# Patient Record
Sex: Male | Born: 1960 | Race: Black or African American | Hispanic: No | Marital: Single | Smoking: Current every day smoker
Health system: Southern US, Community
[De-identification: ages and names within clinical notes are randomized; demographics above are authoritative.]

## PROBLEM LIST (undated history)

## (undated) DIAGNOSIS — F209 Schizophrenia, unspecified: Secondary | ICD-10-CM

## (undated) DIAGNOSIS — F329 Major depressive disorder, single episode, unspecified: Secondary | ICD-10-CM

## (undated) DIAGNOSIS — F419 Anxiety disorder, unspecified: Secondary | ICD-10-CM

## (undated) DIAGNOSIS — E7800 Pure hypercholesterolemia, unspecified: Secondary | ICD-10-CM

## (undated) DIAGNOSIS — N200 Calculus of kidney: Secondary | ICD-10-CM

## (undated) DIAGNOSIS — I10 Essential (primary) hypertension: Secondary | ICD-10-CM

## (undated) DIAGNOSIS — M797 Fibromyalgia: Secondary | ICD-10-CM

## (undated) DIAGNOSIS — F32A Depression, unspecified: Secondary | ICD-10-CM

## (undated) DIAGNOSIS — G8929 Other chronic pain: Secondary | ICD-10-CM

## (undated) DIAGNOSIS — K219 Gastro-esophageal reflux disease without esophagitis: Secondary | ICD-10-CM

## (undated) HISTORY — PX: CERVICAL DISC SURGERY: SHX588

## (undated) HISTORY — PX: LITHOTRIPSY: SUR834

## (undated) HISTORY — PX: HERNIA REPAIR: SHX51

---

## 2001-05-16 ENCOUNTER — Encounter: Payer: Self-pay | Admitting: Urology

## 2001-05-16 ENCOUNTER — Ambulatory Visit (HOSPITAL_COMMUNITY): Admission: RE | Admit: 2001-05-16 | Discharge: 2001-05-16 | Payer: Self-pay | Admitting: Urology

## 2003-11-20 ENCOUNTER — Ambulatory Visit (HOSPITAL_COMMUNITY): Admission: RE | Admit: 2003-11-20 | Discharge: 2003-11-20 | Payer: Self-pay | Admitting: Urology

## 2003-12-19 ENCOUNTER — Ambulatory Visit (HOSPITAL_COMMUNITY): Admission: RE | Admit: 2003-12-19 | Discharge: 2003-12-19 | Payer: Self-pay | Admitting: Family Medicine

## 2004-01-10 ENCOUNTER — Emergency Department (HOSPITAL_COMMUNITY): Admission: EM | Admit: 2004-01-10 | Discharge: 2004-01-10 | Payer: Self-pay | Admitting: Emergency Medicine

## 2005-09-16 ENCOUNTER — Encounter (INDEPENDENT_AMBULATORY_CARE_PROVIDER_SITE_OTHER): Payer: Self-pay | Admitting: Family Medicine

## 2005-12-27 ENCOUNTER — Ambulatory Visit (HOSPITAL_COMMUNITY): Admission: RE | Admit: 2005-12-27 | Discharge: 2005-12-27 | Payer: Self-pay | Admitting: Urology

## 2006-02-14 ENCOUNTER — Ambulatory Visit (HOSPITAL_COMMUNITY): Admission: RE | Admit: 2006-02-14 | Discharge: 2006-02-14 | Payer: Self-pay | Admitting: Urology

## 2006-05-17 ENCOUNTER — Ambulatory Visit: Payer: Self-pay | Admitting: Family Medicine

## 2006-05-25 ENCOUNTER — Encounter (INDEPENDENT_AMBULATORY_CARE_PROVIDER_SITE_OTHER): Payer: Self-pay | Admitting: Family Medicine

## 2006-05-25 LAB — CONVERTED CEMR LAB
Cholesterol: 234 mg/dL
Triglycerides: 407 mg/dL

## 2006-05-31 ENCOUNTER — Ambulatory Visit: Payer: Self-pay | Admitting: Family Medicine

## 2006-06-06 ENCOUNTER — Ambulatory Visit (HOSPITAL_COMMUNITY): Admission: RE | Admit: 2006-06-06 | Discharge: 2006-06-06 | Payer: Self-pay | Admitting: Family Medicine

## 2006-06-06 ENCOUNTER — Ambulatory Visit: Payer: Self-pay | Admitting: Family Medicine

## 2006-06-16 ENCOUNTER — Ambulatory Visit: Payer: Self-pay | Admitting: Family Medicine

## 2006-06-20 ENCOUNTER — Ambulatory Visit: Payer: Self-pay | Admitting: Family Medicine

## 2006-06-27 ENCOUNTER — Ambulatory Visit: Payer: Self-pay | Admitting: Family Medicine

## 2006-06-27 LAB — CONVERTED CEMR LAB: Hemoglobin: 12.8 g/dL

## 2006-07-01 ENCOUNTER — Ambulatory Visit: Payer: Self-pay | Admitting: Family Medicine

## 2006-07-01 ENCOUNTER — Ambulatory Visit: Payer: Self-pay | Admitting: Gastroenterology

## 2006-07-05 ENCOUNTER — Ambulatory Visit: Payer: Self-pay | Admitting: Family Medicine

## 2006-07-07 ENCOUNTER — Encounter (INDEPENDENT_AMBULATORY_CARE_PROVIDER_SITE_OTHER): Payer: Self-pay | Admitting: Family Medicine

## 2006-07-19 ENCOUNTER — Ambulatory Visit: Payer: Self-pay | Admitting: Internal Medicine

## 2006-07-25 ENCOUNTER — Encounter (INDEPENDENT_AMBULATORY_CARE_PROVIDER_SITE_OTHER): Payer: Self-pay | Admitting: *Deleted

## 2006-07-25 ENCOUNTER — Ambulatory Visit (HOSPITAL_COMMUNITY): Admission: RE | Admit: 2006-07-25 | Discharge: 2006-07-25 | Payer: Self-pay | Admitting: Gastroenterology

## 2006-07-25 ENCOUNTER — Ambulatory Visit: Payer: Self-pay | Admitting: Gastroenterology

## 2006-08-05 ENCOUNTER — Ambulatory Visit: Payer: Self-pay | Admitting: Family Medicine

## 2006-08-16 ENCOUNTER — Encounter: Payer: Self-pay | Admitting: Family Medicine

## 2006-08-16 ENCOUNTER — Ambulatory Visit (HOSPITAL_COMMUNITY): Admission: RE | Admit: 2006-08-16 | Discharge: 2006-08-16 | Payer: Self-pay | Admitting: Urology

## 2006-08-16 DIAGNOSIS — F329 Major depressive disorder, single episode, unspecified: Secondary | ICD-10-CM

## 2006-08-16 DIAGNOSIS — M25579 Pain in unspecified ankle and joints of unspecified foot: Secondary | ICD-10-CM

## 2006-08-16 DIAGNOSIS — E669 Obesity, unspecified: Secondary | ICD-10-CM

## 2006-08-16 DIAGNOSIS — F528 Other sexual dysfunction not due to a substance or known physiological condition: Secondary | ICD-10-CM

## 2006-08-16 DIAGNOSIS — F200 Paranoid schizophrenia: Secondary | ICD-10-CM

## 2006-08-16 DIAGNOSIS — R7989 Other specified abnormal findings of blood chemistry: Secondary | ICD-10-CM | POA: Insufficient documentation

## 2006-08-16 DIAGNOSIS — I10 Essential (primary) hypertension: Secondary | ICD-10-CM

## 2006-08-16 DIAGNOSIS — K219 Gastro-esophageal reflux disease without esophagitis: Secondary | ICD-10-CM | POA: Insufficient documentation

## 2006-08-16 DIAGNOSIS — N200 Calculus of kidney: Secondary | ICD-10-CM | POA: Insufficient documentation

## 2006-08-16 DIAGNOSIS — F411 Generalized anxiety disorder: Secondary | ICD-10-CM | POA: Insufficient documentation

## 2006-08-16 DIAGNOSIS — E785 Hyperlipidemia, unspecified: Secondary | ICD-10-CM

## 2006-08-16 DIAGNOSIS — J40 Bronchitis, not specified as acute or chronic: Secondary | ICD-10-CM

## 2006-08-16 DIAGNOSIS — F17200 Nicotine dependence, unspecified, uncomplicated: Secondary | ICD-10-CM

## 2006-09-15 ENCOUNTER — Ambulatory Visit: Payer: Self-pay | Admitting: Family Medicine

## 2006-10-10 ENCOUNTER — Encounter (INDEPENDENT_AMBULATORY_CARE_PROVIDER_SITE_OTHER): Payer: Self-pay | Admitting: Internal Medicine

## 2006-10-10 ENCOUNTER — Encounter (INDEPENDENT_AMBULATORY_CARE_PROVIDER_SITE_OTHER): Payer: Self-pay | Admitting: Family Medicine

## 2006-10-10 LAB — CONVERTED CEMR LAB
AST: 29 units/L (ref 0–37)
Albumin: 4 g/dL (ref 3.5–5.2)
BUN: 12 mg/dL (ref 6–23)
Basophils Relative: 0 % (ref 0–1)
Blood Glucose, Fasting: 115 mg/dL
Blood Glucose, Fasting: 115 mg/dL
CO2: 27 meq/L (ref 19–32)
Calcium: 9.3 mg/dL
Calcium: 9.3 mg/dL (ref 8.4–10.5)
Cholesterol: 239 mg/dL
Cholesterol: 239 mg/dL — ABNORMAL HIGH (ref 0–200)
Creatinine, Ser: 0.97 mg/dL
Eosinophils Relative: 2 % (ref 0–5)
HCT: 43.2 %
HCT: 43.2 % (ref 39.0–52.0)
HDL: 33 mg/dL — ABNORMAL LOW (ref >39)
Hemoglobin: 13.8 g/dL
Lymphocytes Relative: 44 % (ref 12–46)
Lymphs Abs: 4.3 10*3/uL — ABNORMAL HIGH (ref 0.7–3.3)
Monocytes Absolute: 0.7 10*3/uL (ref 0.2–0.7)
Monocytes Relative: 8 % (ref 3–11)
Neutrophils Relative %: 47 % (ref 43–77)
Platelets: 268 10*3/uL (ref 150–400)
RBC count: 5.05 10*6/uL
RBC: 5.05 M/uL (ref 4.22–5.81)
Total Protein: 7 g/dL (ref 6.0–8.3)
Triglycerides: 442 mg/dL
Triglycerides: 442 mg/dL — ABNORMAL HIGH (ref <150)

## 2006-10-14 ENCOUNTER — Ambulatory Visit: Payer: Self-pay | Admitting: Family Medicine

## 2006-11-11 ENCOUNTER — Ambulatory Visit: Payer: Self-pay | Admitting: Family Medicine

## 2006-11-23 ENCOUNTER — Encounter (INDEPENDENT_AMBULATORY_CARE_PROVIDER_SITE_OTHER): Payer: Self-pay | Admitting: Family Medicine

## 2006-12-13 ENCOUNTER — Telehealth (INDEPENDENT_AMBULATORY_CARE_PROVIDER_SITE_OTHER): Payer: Self-pay | Admitting: Family Medicine

## 2007-01-13 ENCOUNTER — Encounter (INDEPENDENT_AMBULATORY_CARE_PROVIDER_SITE_OTHER): Payer: Self-pay | Admitting: Family Medicine

## 2007-01-26 ENCOUNTER — Ambulatory Visit: Payer: Self-pay | Admitting: Family Medicine

## 2007-02-09 ENCOUNTER — Ambulatory Visit: Payer: Self-pay | Admitting: Family Medicine

## 2007-02-09 LAB — CONVERTED CEMR LAB: Cholesterol, target level: 200 mg/dL

## 2007-03-07 ENCOUNTER — Ambulatory Visit: Payer: Self-pay | Admitting: Family Medicine

## 2007-03-09 ENCOUNTER — Encounter (INDEPENDENT_AMBULATORY_CARE_PROVIDER_SITE_OTHER): Payer: Self-pay | Admitting: Family Medicine

## 2007-03-23 ENCOUNTER — Encounter (INDEPENDENT_AMBULATORY_CARE_PROVIDER_SITE_OTHER): Payer: Self-pay | Admitting: Family Medicine

## 2007-04-05 ENCOUNTER — Ambulatory Visit: Payer: Self-pay | Admitting: Family Medicine

## 2007-04-06 ENCOUNTER — Encounter (INDEPENDENT_AMBULATORY_CARE_PROVIDER_SITE_OTHER): Payer: Self-pay | Admitting: Family Medicine

## 2007-04-07 ENCOUNTER — Encounter (INDEPENDENT_AMBULATORY_CARE_PROVIDER_SITE_OTHER): Payer: Self-pay | Admitting: Family Medicine

## 2007-04-19 ENCOUNTER — Encounter (INDEPENDENT_AMBULATORY_CARE_PROVIDER_SITE_OTHER): Payer: Self-pay | Admitting: Family Medicine

## 2007-05-01 ENCOUNTER — Encounter (INDEPENDENT_AMBULATORY_CARE_PROVIDER_SITE_OTHER): Payer: Self-pay | Admitting: Family Medicine

## 2007-05-01 ENCOUNTER — Emergency Department (HOSPITAL_COMMUNITY): Admission: EM | Admit: 2007-05-01 | Discharge: 2007-05-01 | Payer: Self-pay | Admitting: Emergency Medicine

## 2007-05-01 ENCOUNTER — Telehealth (INDEPENDENT_AMBULATORY_CARE_PROVIDER_SITE_OTHER): Payer: Self-pay | Admitting: Family Medicine

## 2007-05-01 ENCOUNTER — Telehealth (INDEPENDENT_AMBULATORY_CARE_PROVIDER_SITE_OTHER): Payer: Self-pay | Admitting: *Deleted

## 2007-07-11 ENCOUNTER — Encounter (INDEPENDENT_AMBULATORY_CARE_PROVIDER_SITE_OTHER): Payer: Self-pay | Admitting: Family Medicine

## 2007-08-18 ENCOUNTER — Encounter (INDEPENDENT_AMBULATORY_CARE_PROVIDER_SITE_OTHER): Payer: Self-pay | Admitting: Family Medicine

## 2008-02-02 ENCOUNTER — Encounter (INDEPENDENT_AMBULATORY_CARE_PROVIDER_SITE_OTHER): Payer: Self-pay | Admitting: Family Medicine

## 2008-11-06 ENCOUNTER — Ambulatory Visit (HOSPITAL_COMMUNITY): Admission: RE | Admit: 2008-11-06 | Discharge: 2008-11-06 | Payer: Self-pay | Admitting: Podiatry

## 2009-01-22 ENCOUNTER — Encounter: Admission: RE | Admit: 2009-01-22 | Discharge: 2009-01-22 | Payer: Self-pay | Admitting: Orthopedic Surgery

## 2010-11-01 ENCOUNTER — Encounter: Payer: Self-pay | Admitting: Orthopedic Surgery

## 2011-01-25 LAB — CREATININE, SERUM
Creatinine, Ser: 0.78 mg/dL (ref 0.4–1.5)
GFR calc non Af Amer: >60 mL/min (ref >60)

## 2011-02-26 NOTE — Op Note (Signed)
NAME:  EDITH, LORD             ACCOUNT NO.:  [id number]   MEDICAL RECORD NO.:  [id number]          PATIENT TYPE:  AMB   LOCATION:  DAY                           FACILITY:  [facility]   PHYSICIAN:  Kassie Mends, M.D.      DATE OF BIRTH:  [birth year]   DATE OF PROCEDURE:  07/25/2006  DATE OF DISCHARGE:  07/25/2006                                 OPERATIVE REPORT   REFERRING PHYSICIAN:  Dr. Franchot Heidelberg   PROCEDURE:  1. Colonoscopy with cold forceps biopsy.  2. Esophagogastroduodenoscopy.   INDICATION FOR EXAMINATION:  Mr. Brahmbhatt was a 50 year old male with rectal  bleeding on an unspecified anti-inflammatory drug.  The patient is unable to  recall the name of the drug.  His hemoglobin decreased from 13 to 10.6.  He  has a significant past medical history of hemorrhoids.   FINDINGS:  1. 3mm descending colon polyp removed via cold forceps.  2. Moderate internal hemorrhoids.  Otherwise no polyps, masses,      inflammatory changes or vascular ectasia seen on colonoscopy.  3. Normal esophagus without evidence of Barrett's.  Normal stomach without      evidence of erosion, erythema or ulceration.  Normal duodenal bulb and      second portion of the duodenum.   RECOMMENDATIONS:  1. Screening colonoscopy in 5 years if polyp adenomatous.  2. High-fiber diet.  Patient given information on hemorrhoids, high-fiber      fiber diet, and polyps.  3. Colace 1 mg twice a day.  Citrucel 1 to 2 times daily.  4. Follow up with Dr. Erby Pian.   PROCEDURE TECHNIQUE:  Physical exam was performed, and informed consent was  obtained from the patient after explaining the benefits, risks and  alternatives to the procedure.  The patient was connected to the monitor and  placed in the left lateral position.  Continuous suction was provided by  nasal cannula, and IV medicine administered through an indwelling cannula.  After administration of sedation and rectal exam, the patient's rectum was  intubated, and the scope was passed under direct visualization to the cecum.  The scope was withdrawn slowly by carefully examining the color, texture,  anatomy and integrity of mucosa on the way out.   After colonoscopy, the diagnostic gastroscope was passed into the patients  esophagus and passed undirect visualization to the second portion of the  duodenum. The scope was withdrawn slowly by carefully examining the color,  texture, anatomy and integrity of mucosa on the way out.  The patient was  recovered in endoscopy suite and discharged home in satisfactory condition.      Kassie Mends, M.D.  Electronically Signed     SM/MEDQ  D:  07/26/2006  T:  07/27/2006  Job:  [id number]   cc:   Theophilus Bones, M.D.
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
## 2011-07-05 ENCOUNTER — Encounter: Payer: Self-pay | Admitting: Gastroenterology

## 2011-07-26 LAB — RAPID URINE DRUG SCREEN, HOSP PERFORMED: Barbiturates: NOT DETECTED

## 2012-01-05 ENCOUNTER — Other Ambulatory Visit (HOSPITAL_COMMUNITY): Payer: Self-pay | Admitting: Family Medicine

## 2012-01-05 DIAGNOSIS — R52 Pain, unspecified: Secondary | ICD-10-CM

## 2012-01-06 ENCOUNTER — Other Ambulatory Visit (HOSPITAL_COMMUNITY): Payer: Self-pay

## 2012-01-06 ENCOUNTER — Ambulatory Visit (HOSPITAL_COMMUNITY)
Admission: RE | Admit: 2012-01-06 | Discharge: 2012-01-06 | Disposition: A | Discharge status: Home / Self Care | Payer: Medicare Other | Source: Ambulatory Visit | Attending: Family Medicine | Admitting: Family Medicine

## 2012-01-06 DIAGNOSIS — R1033 Periumbilical pain: Secondary | ICD-10-CM | POA: Insufficient documentation

## 2012-01-06 DIAGNOSIS — K429 Umbilical hernia without obstruction or gangrene: Secondary | ICD-10-CM | POA: Insufficient documentation

## 2012-01-06 DIAGNOSIS — N200 Calculus of kidney: Secondary | ICD-10-CM | POA: Insufficient documentation

## 2012-01-06 DIAGNOSIS — R933 Abnormal findings on diagnostic imaging of other parts of digestive tract: Secondary | ICD-10-CM | POA: Insufficient documentation

## 2012-01-06 DIAGNOSIS — R52 Pain, unspecified: Secondary | ICD-10-CM

## 2012-01-06 MED ORDER — IOHEXOL 300 MG/ML  SOLN
100.0000 mL | Freq: Once | INTRAMUSCULAR | Status: AC | PRN
  Administered 2012-01-06: 100 mL via INTRAVENOUS

## 2012-01-27 ENCOUNTER — Encounter (HOSPITAL_COMMUNITY): Payer: Self-pay | Admitting: Pharmacy Technician

## 2012-01-27 ENCOUNTER — Other Ambulatory Visit (INDEPENDENT_AMBULATORY_CARE_PROVIDER_SITE_OTHER): Payer: Self-pay | Admitting: *Deleted

## 2012-01-27 ENCOUNTER — Telehealth (INDEPENDENT_AMBULATORY_CARE_PROVIDER_SITE_OTHER): Payer: Self-pay | Admitting: *Deleted

## 2012-01-27 ENCOUNTER — Encounter (INDEPENDENT_AMBULATORY_CARE_PROVIDER_SITE_OTHER): Payer: Self-pay | Admitting: *Deleted

## 2012-01-27 DIAGNOSIS — R935 Abnormal findings on diagnostic imaging of other abdominal regions, including retroperitoneum: Secondary | ICD-10-CM

## 2012-01-27 NOTE — Telephone Encounter (Signed)
Requesting MD: bradford  PCP:       Name & DOB: Debroah Loop Conant     Procedure: egd  Reason/Indication:  Abnormal ct  Has patient had this procedure before?    If so, when, by whom and where?    Is there a family history of colon cancer?    Who?  What age when diagnosed?    Is patient diabetic?   yes      Does patient have prosthetic heart valve?  no  Do you have a pacemaker?  no  Has patient had joint replacement within last 12 months?  no  Is patient on Coumadin, Plavix and/or Aspirin? no  Medications: gabapentin 300 mg 2 tab tid, amlodipine 5 mg daily, olanzapine 15 mg, alprazolam 1 mg tid, pravastatin 40 mg, metformin 500 mg tid, dexilant 60 mg daily  Allergies: anti-inflammatory  Pharmacy:   Medication Adjustment:   Procedure date & time: 01/28/12 @ 1200

## 2012-01-27 NOTE — Telephone Encounter (Signed)
Hold evening dose of metformin

## 2012-01-27 NOTE — Telephone Encounter (Signed)
Agree 
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
## 2012-01-28 ENCOUNTER — Ambulatory Visit (HOSPITAL_COMMUNITY)
Admission: RE | Admit: 2012-01-28 | Discharge: 2012-01-28 | Disposition: A | Discharge status: Home / Self Care | Payer: Medicare Other | Source: Ambulatory Visit | Attending: Internal Medicine | Admitting: Internal Medicine

## 2012-01-28 ENCOUNTER — Encounter (HOSPITAL_COMMUNITY)
Admission: RE | Disposition: A | Discharge status: Home / Self Care | Payer: Self-pay | Source: Ambulatory Visit | Attending: Internal Medicine

## 2012-01-28 ENCOUNTER — Encounter (HOSPITAL_COMMUNITY): Payer: Self-pay | Admitting: *Deleted

## 2012-01-28 DIAGNOSIS — I10 Essential (primary) hypertension: Secondary | ICD-10-CM | POA: Insufficient documentation

## 2012-01-28 DIAGNOSIS — Z01812 Encounter for preprocedural laboratory examination: Secondary | ICD-10-CM | POA: Insufficient documentation

## 2012-01-28 DIAGNOSIS — E7800 Pure hypercholesterolemia, unspecified: Secondary | ICD-10-CM | POA: Insufficient documentation

## 2012-01-28 DIAGNOSIS — R12 Heartburn: Secondary | ICD-10-CM | POA: Insufficient documentation

## 2012-01-28 DIAGNOSIS — R933 Abnormal findings on diagnostic imaging of other parts of digestive tract: Secondary | ICD-10-CM

## 2012-01-28 DIAGNOSIS — Z79899 Other long term (current) drug therapy: Secondary | ICD-10-CM | POA: Insufficient documentation

## 2012-01-28 DIAGNOSIS — R935 Abnormal findings on diagnostic imaging of other abdominal regions, including retroperitoneum: Secondary | ICD-10-CM

## 2012-01-28 DIAGNOSIS — K219 Gastro-esophageal reflux disease without esophagitis: Secondary | ICD-10-CM

## 2012-01-28 DIAGNOSIS — E119 Type 2 diabetes mellitus without complications: Secondary | ICD-10-CM | POA: Insufficient documentation

## 2012-01-28 HISTORY — DX: Calculus of kidney: N20.0

## 2012-01-28 HISTORY — DX: Other chronic pain: G89.29

## 2012-01-28 HISTORY — DX: Anxiety disorder, unspecified: F41.9

## 2012-01-28 HISTORY — DX: Major depressive disorder, single episode, unspecified: F32.9

## 2012-01-28 HISTORY — DX: Fibromyalgia: M79.7

## 2012-01-28 HISTORY — DX: Pure hypercholesterolemia, unspecified: E78.00

## 2012-01-28 HISTORY — DX: Depression, unspecified: F32.A

## 2012-01-28 HISTORY — DX: Gastro-esophageal reflux disease without esophagitis: K21.9

## 2012-01-28 HISTORY — DX: Schizophrenia, unspecified: F20.9

## 2012-01-28 HISTORY — PX: ESOPHAGOGASTRODUODENOSCOPY: SHX5428

## 2012-01-28 HISTORY — DX: Essential (primary) hypertension: I10

## 2012-01-28 LAB — GLUCOSE, CAPILLARY: Glucose-Capillary: 95 mg/dL (ref 70–99)

## 2012-01-28 SURGERY — EGD (ESOPHAGOGASTRODUODENOSCOPY)
Anesthesia: Moderate Sedation

## 2012-01-28 MED ORDER — MEPERIDINE HCL 50 MG/ML IJ SOLN
INTRAMUSCULAR | Status: AC
  Filled 2012-01-28: qty 1

## 2012-01-28 MED ORDER — STERILE WATER FOR IRRIGATION IR SOLN
Status: DC | PRN
  Administered 2012-01-28: 13:00:00

## 2012-01-28 MED ORDER — SODIUM CHLORIDE 0.45 % IV SOLN
Freq: Once | INTRAVENOUS | Status: AC
  Administered 2012-01-28: 13:00:00 via INTRAVENOUS

## 2012-01-28 MED ORDER — MIDAZOLAM HCL 5 MG/5ML IJ SOLN
INTRAMUSCULAR | Status: AC
  Filled 2012-01-28: qty 10

## 2012-01-28 MED ORDER — MEPERIDINE HCL 25 MG/ML IJ SOLN
INTRAMUSCULAR | Status: DC | PRN
  Administered 2012-01-28 (×2): 25 mg via INTRAVENOUS

## 2012-01-28 MED ORDER — MIDAZOLAM HCL 5 MG/5ML IJ SOLN
INTRAMUSCULAR | Status: DC | PRN
  Administered 2012-01-28: 3 mg via INTRAVENOUS
  Administered 2012-01-28: 2 mg via INTRAVENOUS
  Administered 2012-01-28: 3 mg via INTRAVENOUS

## 2012-01-28 MED ORDER — BUTAMBEN-TETRACAINE-BENZOCAINE 2-2-14 % EX AERO
INHALATION_SPRAY | CUTANEOUS | Status: DC | PRN
  Administered 2012-01-28: 1 via TOPICAL
  Administered 2012-01-28: 2 via TOPICAL

## 2012-01-28 NOTE — Op Note (Signed)
EGD PROCEDURE REPORT  PATIENT:  Gregory Payne  MR#:  [id number] Birthdate:  1960-12-14, 50 y.o., male Endoscopist:  Dr. Malissa Hippo, MD Referred By:  Dr. Barbaraann Barthel, M.D.  Procedure Date: 01/28/2012  Procedure:   EGD  Indications:  Patient is 51 year old asthmatic and male with abdominal and pelvic CT recently and thickening of the gastric wall. He has chronic GERD and symptoms of uncontrolled with PPI. He has a history of GI bleed secondary to NSAID therapy 6 years ago. He does not take any NSAIDs.            Informed Consent:  The risks, benefits, alternatives & imponderables which include, but are not limited to, bleeding, infection, perforation, drug reaction and potential missed lesion have been reviewed.  The potential for biopsy, lesion removal, esophageal dilation, etc. have also been discussed.  Questions have been answered.  All parties agreeable.  Please see history & physical in medical record for more information.  Medications:  Demerol 50 mg IV Versed 8 mg IV Cetacaine spray topically for oropharyngeal anesthesia  Description of procedure:  The endoscope was introduced through the mouth and advanced to the second portion of the duodenum without difficulty or limitations. The mucosal surfaces were surveyed very carefully during advancement of the scope and upon withdrawal.  Findings:  Esophagus:  Normal mucosa of the esophagus. GEJ:  45 cm Stomach:  Stomach was empty and distended very well with insufflation. Folds in the proximal stomach were normal. Examination of mucosa at body, antrum, pyloric channel angularis, fundus and cardia was normal. Duodenum:  Normal bulbar and post bulbar medical.  Therapeutic/Diagnostic Maneuvers Performed:  None  Complications:  None  Impression: Normal esophagogastroduodenoscopy.   Recommendations:  Standard instructions given. Patient to followup with Dr. Malvin Johns regarding periumblical  Hernia.  Sejla Marzano U   01/28/2012  1:45 PM  CC: Dr. Isabella Stalling, MD, MD          Dr. Barbaraann Barthel, MD

## 2012-01-28 NOTE — Discharge Instructions (Signed)
Resume medications and diet. No driving for [id number]. Follow with Dr. Malvin Johns as planned

## 2012-01-28 NOTE — H&P (Signed)
Gregory Payne is an 51 y.o. male.   Chief Complaint: Patient is here for esophagogastroduodenoscopy. HPI: Patient is a 51 year old black male who recently underwent abdominopelvic CT because of peri-umblical knot. This study reveals thickening to the gastric wall. Patient gives several year history of heartburn. Previously was on AcipHex and now on Dexilant. He denies epigastric pain nausea vomiting dysphagia anorexia or melena. He does not take any NSAIDs.  Past Medical History  Diagnosis Date  . Diabetes mellitus   . Hypertension   . Hypercholesteremia   . Anxiety   . Depression   . Chronic pain   . Fibromyalgia   . GERD (gastroesophageal reflux disease)   . Kidney stones   . Schizophrenia     Past Surgical History  Procedure Date  . Lithotripsy   . Hernia repair     left inguinal hernia  . Cervical disc surgery     Family History  Problem Relation Age of Onset  . Colon cancer Neg Hx    Social History:  reports that he has been smoking.  He does not have any smokeless tobacco history on file. He reports that he drinks alcohol. He reports that he uses illicit drugs (Marijuana).  Allergies:  Allergies  Allergen Reactions  . Naproxen     REACTION: GI bleed from internal Hemmorhoids    Medications Prior to Admission  Medication Dose Route Frequency Provider Last Rate Last Dose  . 0.45 % sodium chloride infusion   Intravenous Once Malissa Hippo, MD 20 mL/hr at 01/28/12 1236    . meperidine (DEMEROL) 50 MG/ML injection           . midazolam (VERSED) 5 MG/5ML injection            Medications Prior to Admission  Medication Sig Dispense Refill  . ALPRAZolam (XANAX) 1 MG tablet Take 1 mg by mouth 3 (three) times daily as needed.      Marland Kitchen amLODipine (NORVASC) 5 MG tablet Take 5 mg by mouth daily.      . cephALEXin (KEFLEX) 500 MG capsule Take 500 mg by mouth 4 (four) times daily.      Marland Kitchen dexlansoprazole (DEXILANT) 60 MG capsule Take 60 mg by mouth daily.      Marland Kitchen docusate  sodium (COLACE) 100 MG capsule Take 400 mg by mouth daily.      Marland Kitchen gabapentin (NEURONTIN) 300 MG capsule Take 600 mg by mouth 3 (three) times daily.      . metFORMIN (GLUCOPHAGE) 500 MG tablet Take 500 mg by mouth 3 (three) times daily with meals.      Marland Kitchen OLANZapine (ZYPREXA) 15 MG tablet Take 15 mg by mouth at bedtime.      . pravastatin (PRAVACHOL) 40 MG tablet Take 40 mg by mouth daily.        Results for orders placed during the hospital encounter of 01/28/12 (from the past 48 hour(s))  GLUCOSE, CAPILLARY     Status: Normal   Collection Time   01/28/12 12:26 PM      Component Value Range Comment   Glucose-Capillary 95  70 - 99 (mg/dL)    Comment 1 Documented in Chart      No results found.  Review of Systems  Constitutional: Negative for weight loss.  Gastrointestinal: Negative for diarrhea, constipation, blood in stool and melena.    Blood pressure 140/93, pulse 63, temperature 97.5 F (36.4 C), temperature source Oral, resp. rate 16, height 6\' 3"  (1.905 m), weight 243  lb (110.224 kg), SpO2 99.00%. Physical Exam  Constitutional: He appears well-developed and well-nourished.  HENT:  Mouth/Throat: Oropharynx is clear and moist.  Eyes: Conjunctivae are normal. No scleral icterus.  Neck: No thyromegaly present.  Cardiovascular: Normal rate, regular rhythm and normal heart sounds.   No murmur heard. Respiratory: Effort normal and breath sounds normal.  GI: Soft. He exhibits no distension and no mass. There is no tenderness.       Small hernia superior to umbilicus  Musculoskeletal: He exhibits no edema.  Lymphadenopathy:    He has no cervical adenopathy.  Neurological: He is alert.  Skin: Skin is warm and dry.     Assessment/Plan Abnormal CT with thickening to gastric wall. Diagnostic EGD.  Gregory Payne U 01/28/2012, 1:19 PM

## 2012-01-31 ENCOUNTER — Other Ambulatory Visit (HOSPITAL_COMMUNITY): Payer: Medicaid Other

## 2012-02-02 ENCOUNTER — Encounter (INDEPENDENT_AMBULATORY_CARE_PROVIDER_SITE_OTHER): Payer: Self-pay

## 2012-02-02 ENCOUNTER — Encounter (HOSPITAL_COMMUNITY): Payer: Self-pay | Admitting: Internal Medicine

## 2012-02-02 ENCOUNTER — Ambulatory Visit (INDEPENDENT_AMBULATORY_CARE_PROVIDER_SITE_OTHER): Payer: Medicaid Other | Admitting: Internal Medicine

## 2012-02-07 ENCOUNTER — Ambulatory Visit (HOSPITAL_COMMUNITY): Admission: RE | Admit: 2012-02-07 | Payer: Medicaid Other | Source: Ambulatory Visit | Admitting: General Surgery

## 2012-02-07 ENCOUNTER — Encounter (HOSPITAL_COMMUNITY): Admission: RE | Payer: Self-pay | Source: Ambulatory Visit

## 2012-02-07 SURGERY — REPAIR, HERNIA, UMBILICAL, ADULT
Anesthesia: General

## 2012-10-04 ENCOUNTER — Encounter (HOSPITAL_COMMUNITY): Payer: Self-pay

## 2012-10-04 ENCOUNTER — Emergency Department (HOSPITAL_COMMUNITY): Payer: Medicare Other

## 2012-10-04 ENCOUNTER — Inpatient Hospital Stay (HOSPITAL_COMMUNITY)
Admission: EM | Admit: 2012-10-04 | Discharge: 2012-10-10 | DRG: 917 | Disposition: A | Discharge status: Home / Self Care | Payer: Medicare Other | Attending: Internal Medicine | Admitting: Internal Medicine

## 2012-10-04 DIAGNOSIS — E119 Type 2 diabetes mellitus without complications: Secondary | ICD-10-CM | POA: Diagnosis present

## 2012-10-04 DIAGNOSIS — F17200 Nicotine dependence, unspecified, uncomplicated: Secondary | ICD-10-CM | POA: Diagnosis present

## 2012-10-04 DIAGNOSIS — J9600 Acute respiratory failure, unspecified whether with hypoxia or hypercapnia: Secondary | ICD-10-CM | POA: Diagnosis present

## 2012-10-04 DIAGNOSIS — E785 Hyperlipidemia, unspecified: Secondary | ICD-10-CM | POA: Diagnosis present

## 2012-10-04 DIAGNOSIS — F1410 Cocaine abuse, uncomplicated: Secondary | ICD-10-CM | POA: Diagnosis present

## 2012-10-04 DIAGNOSIS — R031 Nonspecific low blood-pressure reading: Secondary | ICD-10-CM | POA: Diagnosis not present

## 2012-10-04 DIAGNOSIS — I10 Essential (primary) hypertension: Secondary | ICD-10-CM | POA: Diagnosis present

## 2012-10-04 DIAGNOSIS — Z886 Allergy status to analgesic agent status: Secondary | ICD-10-CM

## 2012-10-04 DIAGNOSIS — F209 Schizophrenia, unspecified: Secondary | ICD-10-CM | POA: Diagnosis present

## 2012-10-04 DIAGNOSIS — F1010 Alcohol abuse, uncomplicated: Secondary | ICD-10-CM | POA: Diagnosis present

## 2012-10-04 DIAGNOSIS — T465X1A Poisoning by other antihypertensive drugs, accidental (unintentional), initial encounter: Principal | ICD-10-CM | POA: Diagnosis present

## 2012-10-04 DIAGNOSIS — G9340 Encephalopathy, unspecified: Secondary | ICD-10-CM

## 2012-10-04 DIAGNOSIS — T426X1A Poisoning by other antiepileptic and sedative-hypnotic drugs, accidental (unintentional), initial encounter: Secondary | ICD-10-CM | POA: Diagnosis present

## 2012-10-04 DIAGNOSIS — F200 Paranoid schizophrenia: Secondary | ICD-10-CM

## 2012-10-04 DIAGNOSIS — E7800 Pure hypercholesterolemia, unspecified: Secondary | ICD-10-CM | POA: Diagnosis present

## 2012-10-04 DIAGNOSIS — Y92009 Unspecified place in unspecified non-institutional (private) residence as the place of occurrence of the external cause: Secondary | ICD-10-CM

## 2012-10-04 DIAGNOSIS — Z23 Encounter for immunization: Secondary | ICD-10-CM

## 2012-10-04 DIAGNOSIS — F329 Major depressive disorder, single episode, unspecified: Secondary | ICD-10-CM | POA: Diagnosis present

## 2012-10-04 DIAGNOSIS — G92 Toxic encephalopathy: Secondary | ICD-10-CM | POA: Diagnosis present

## 2012-10-04 DIAGNOSIS — F411 Generalized anxiety disorder: Secondary | ICD-10-CM | POA: Diagnosis present

## 2012-10-04 DIAGNOSIS — T424X4A Poisoning by benzodiazepines, undetermined, initial encounter: Secondary | ICD-10-CM | POA: Diagnosis present

## 2012-10-04 DIAGNOSIS — K219 Gastro-esophageal reflux disease without esophagitis: Secondary | ICD-10-CM | POA: Diagnosis present

## 2012-10-04 DIAGNOSIS — IMO0002 Reserved for concepts with insufficient information to code with codable children: Secondary | ICD-10-CM | POA: Diagnosis not present

## 2012-10-04 DIAGNOSIS — F3289 Other specified depressive episodes: Secondary | ICD-10-CM | POA: Diagnosis present

## 2012-10-04 DIAGNOSIS — Z79899 Other long term (current) drug therapy: Secondary | ICD-10-CM

## 2012-10-04 DIAGNOSIS — F1210 Cannabis abuse, uncomplicated: Secondary | ICD-10-CM | POA: Diagnosis present

## 2012-10-04 DIAGNOSIS — G8929 Other chronic pain: Secondary | ICD-10-CM | POA: Diagnosis present

## 2012-10-04 DIAGNOSIS — T50992A Poisoning by other drugs, medicaments and biological substances, intentional self-harm, initial encounter: Secondary | ICD-10-CM | POA: Diagnosis present

## 2012-10-04 DIAGNOSIS — T50901A Poisoning by unspecified drugs, medicaments and biological substances, accidental (unintentional), initial encounter: Secondary | ICD-10-CM

## 2012-10-04 DIAGNOSIS — G929 Unspecified toxic encephalopathy: Secondary | ICD-10-CM | POA: Diagnosis present

## 2012-10-04 LAB — URINALYSIS, ROUTINE W REFLEX MICROSCOPIC
Leukocytes, UA: NEGATIVE
Nitrite: NEGATIVE
Specific Gravity, Urine: 1.025 (ref 1.005–1.030)
Urobilinogen, UA: 0.2 mg/dL (ref 0.0–1.0)
pH: 5.5 (ref 5.0–8.0)

## 2012-10-04 LAB — BLOOD GAS, ARTERIAL
Acid-base deficit: 3.6 mmol/L — ABNORMAL HIGH (ref 0.0–2.0)
Drawn by: 21694
O2 Content: 40 L/min
TCO2: 19 mmol/L (ref 0–100)
pCO2 arterial: 39 mmHg (ref 35.0–45.0)
pH, Arterial: 7.351 (ref 7.350–7.450)
pO2, Arterial: 81.1 mmHg (ref 80.0–100.0)

## 2012-10-04 LAB — COMPREHENSIVE METABOLIC PANEL
Albumin: 3.8 g/dL (ref 3.5–5.2)
BUN: 6 mg/dL (ref 6–23)
CO2: 22 mEq/L (ref 19–32)
Chloride: 109 mEq/L (ref 96–112)
Creatinine, Ser: 0.88 mg/dL (ref 0.50–1.35)
GFR calc Af Amer: >90 mL/min (ref >90)
GFR calc non Af Amer: >90 mL/min (ref >90)
Glucose, Bld: 78 mg/dL (ref 70–99)
Total Bilirubin: 0.3 mg/dL (ref 0.3–1.2)

## 2012-10-04 LAB — CBC WITH DIFFERENTIAL/PLATELET
Basophils Absolute: 0.1 10*3/uL (ref 0.0–0.1)
Basophils Relative: 1 % (ref 0–1)
HCT: 41.6 % (ref 39.0–52.0)
Hemoglobin: 13.3 g/dL (ref 13.0–17.0)
Lymphs Abs: 3.4 10*3/uL (ref 0.7–4.0)
MCHC: 32 g/dL (ref 30.0–36.0)
MCV: 81.3 fL (ref 78.0–100.0)
Monocytes Relative: 7 % (ref 3–12)
Neutro Abs: 2.7 10*3/uL (ref 1.7–7.7)
RDW: 15.3 % (ref 11.5–15.5)
WBC: 6.8 10*3/uL (ref 4.0–10.5)

## 2012-10-04 LAB — URINE MICROSCOPIC-ADD ON

## 2012-10-04 LAB — LIPASE, BLOOD: Lipase: 75 U/L — ABNORMAL HIGH (ref 11–59)

## 2012-10-04 LAB — RAPID URINE DRUG SCREEN, HOSP PERFORMED: Opiates: NOT DETECTED

## 2012-10-04 LAB — SALICYLATE LEVEL: Salicylate Lvl: 0 mg/dL — ABNORMAL LOW (ref 2.8–20.0)

## 2012-10-04 LAB — ETHANOL: Alcohol, Ethyl (B): 128 mg/dL — ABNORMAL HIGH (ref 0–11)

## 2012-10-04 LAB — ACETAMINOPHEN LEVEL: Acetaminophen (Tylenol), Serum: <15 ug/mL (ref 10–30)

## 2012-10-04 MED ORDER — PROPOFOL 10 MG/ML IV EMUL
INTRAVENOUS | Status: AC
  Filled 2012-10-04: qty 100

## 2012-10-04 MED ORDER — NALOXONE HCL 1 MG/ML IJ SOLN
INTRAMUSCULAR | Status: DC | PRN
  Administered 2012-10-04: 2 mg via INTRAMUSCULAR

## 2012-10-04 MED ORDER — SODIUM CHLORIDE 0.9 % IV BOLUS (SEPSIS): 1000.0000 mL | Freq: Once | INTRAVENOUS | Status: DC

## 2012-10-04 MED ORDER — SUCCINYLCHOLINE CHLORIDE 20 MG/ML IJ SOLN
INTRAMUSCULAR | Status: DC | PRN
  Administered 2012-10-04: 120 mg via INTRAVENOUS

## 2012-10-04 MED ORDER — DOPAMINE-DEXTROSE 3.2-5 MG/ML-% IV SOLN: 5.0000 ug/kg/min | Freq: Once | INTRAVENOUS | Status: DC

## 2012-10-04 MED ORDER — NALOXONE HCL 1 MG/ML IJ SOLN
INTRAMUSCULAR | Status: AC
  Filled 2012-10-04: qty 2

## 2012-10-04 MED ORDER — SODIUM CHLORIDE 0.9 % IV SOLN
INTRAVENOUS | Status: DC | PRN
  Administered 2012-10-04: 1000 mL via INTRAVENOUS

## 2012-10-04 MED ORDER — ETOMIDATE 2 MG/ML IV SOLN
INTRAVENOUS | Status: DC | PRN
  Administered 2012-10-04: 20 mg via INTRAVENOUS

## 2012-10-04 NOTE — ED Provider Notes (Signed)
History  This chart was scribed for Donnetta Hutching, MD by Manuela Schwartz, ED scribe. This patient was seen in room APA01/APA01 and the patient's care was started at 2225.   CSN: [id number]  Arrival date & time 10/04/12  2225   None     Chief Complaint  Patient presents with  . Drug Overdose   Patient is a 51 y.o. male presenting with Overdose. The history is provided by the EMS personnel. No language interpreter was used.  Drug Overdose This is a new problem. The current episode started less than 1 hour ago. The problem occurs constantly. The problem has not changed since onset.Treatments tried: narcan, improved to response w/painful stimuli en route. The treatment provided mild relief.   Gregory Payne is a 51 y.o. male brought in by ambulance, who presents to the Emergency Department complaining of reported overdose after his family found him lying on couch unconscious with empty prescription bottles around him, reportedly had unknown amt of xanax, gabapentin, Norvasc . EMS arrived and pt was unconscious and unresponsive to pain/verbal stimuli; in respiratory distress c pinpoint pupils. EMS administered x1 of 2 mg Narcan en route and inserted nasal trumpet. Patient was intubated with 7.5 ET tube using RSI technique. 02 saturation remained stable.  pt improved w/response to painful stimuli but still mostly unresponsive and w/pinpoint pupils.   Past Medical History  Diagnosis Date  . Diabetes mellitus   . Hypertension   . Hypercholesteremia   . Anxiety   . Depression   . Chronic pain   . Fibromyalgia   . GERD (gastroesophageal reflux disease)   . Kidney stones   . Schizophrenia     Past Surgical History  Procedure Date  . Lithotripsy   . Hernia repair     left inguinal hernia  . Cervical disc surgery   . Esophagogastroduodenoscopy 01/28/2012    Procedure: ESOPHAGOGASTRODUODENOSCOPY (EGD);  Surgeon: Malissa Hippo, MD;  [facility];  Service: Endoscopy;  Laterality:  N/A;  1200    Family History  Problem Relation Age of Onset  . Colon cancer Neg Hx     History  Substance Use Topics  . Smoking status: Current Every Day Smoker -- 0.5 packs/day for 35 years  . Smokeless tobacco: Not on file  . Alcohol Use: Yes     Comment: rarely      Review of Systems  Unable to perform ROS All other systems reviewed and are negative.    Allergies  Naproxen  Home Medications   Current Outpatient Rx  Name  Route  Sig  Dispense  Refill  . ALPRAZOLAM 1 MG PO TABS   Oral   Take 1 mg by mouth 3 (three) times daily as needed.         Marland Kitchen AMLODIPINE BESYLATE 5 MG PO TABS   Oral   Take 5 mg by mouth daily.         . CEPHALEXIN 500 MG PO CAPS   Oral   Take 500 mg by mouth 4 (four) times daily.         . DEXLANSOPRAZOLE 60 MG PO CPDR   Oral   Take 60 mg by mouth daily.         Marland Kitchen DOCUSATE SODIUM 100 MG PO CAPS   Oral   Take 400 mg by mouth daily.         Marland Kitchen GABAPENTIN 300 MG PO CAPS   Oral   Take 600 mg by mouth 3 (  three) times daily.         Marland Kitchen METFORMIN HCL 500 MG PO TABS   Oral   Take 500 mg by mouth 3 (three) times daily with meals.         Marland Kitchen OLANZAPINE 15 MG PO TABS   Oral   Take 15 mg by mouth at bedtime.         Marland Kitchen PRAVASTATIN SODIUM 40 MG PO TABS   Oral   Take 40 mg by mouth daily.           BP 85/48  Pulse 88  Temp 94.6 F (34.8 C) (Axillary)  Resp 26  SpO2 98%  Physical Exam  Nursing note and vitals reviewed. Constitutional:       Unresponsive to deep painful stimuli, and verbally unresponsive  HENT:  Head: Normocephalic and atraumatic.  Eyes: Pupils are equal, round, and reactive to light.       Pinpoint pupils  Neck: Normal range of motion. Neck supple.  Cardiovascular: Normal rate, regular rhythm and normal heart sounds.   Pulmonary/Chest: Effort normal and breath sounds normal. No respiratory distress.  Abdominal: Soft. Bowel sounds are normal.  Musculoskeletal:       unable  Neurological:        unable  Skin: Skin is warm and dry.  Psychiatric:       unable    ED Course  INTUBATION Date/Time: 10/04/2012 10:30 PM Performed by: Manuela Schwartz Authorized by: Donnetta Hutching Consent: The procedure was performed in an emergent situation. Indications: respiratory distress Patient status: paralyzed (RSI) Preoxygenation: nonrebreather mask Sedatives: etomidate Paralytic: succinylcholine Tube size: 7.5 mm Number of attempts: 1 Cords visualized: yes Post-procedure assessment: chest rise and CO2 detector Breath sounds: equal ETT to teeth: 23 cm Chest x-ray interpreted by radiologist.   (including critical care time) DIAGNOSTIC STUDIES: Oxygen Saturation is 98% on room air, normal by my interpretation.    COORDINATION OF CARE: At 1030 PM Discussed treatment plan with patient which includes intubation. Patient agrees.   Labs Reviewed  COMPREHENSIVE METABOLIC PANEL - Abnormal; Notable for the following:    Sodium 146 (*)     All other components within normal limits  ETHANOL - Abnormal; Notable for the following:    Alcohol, Ethyl (B) 128 (*)     All other components within normal limits  URINE RAPID DRUG SCREEN (HOSP PERFORMED) - Abnormal; Notable for the following:    Cocaine POSITIVE (*)     Benzodiazepines POSITIVE (*)     Tetrahydrocannabinol POSITIVE (*)     All other components within normal limits  SALICYLATE LEVEL - Abnormal; Notable for the following:    Salicylate Lvl 0.0 (*)     All other components within normal limits  URINALYSIS, ROUTINE W REFLEX MICROSCOPIC - Abnormal; Notable for the following:    Hgb urine dipstick MODERATE (*)     Ketones, ur TRACE (*)     All other components within normal limits  LIPASE, BLOOD - Abnormal; Notable for the following:    Lipase 75 (*)     All other components within normal limits  BLOOD GAS, ARTERIAL - Abnormal; Notable for the following:    Acid-base deficit 3.6 (*)     All other components within normal limits  CBC  WITH DIFFERENTIAL  ACETAMINOPHEN LEVEL  TROPONIN I  URINE MICROSCOPIC-ADD ON    Dg Chest Port 1 View  10/04/2012  *RADIOLOGY REPORT*  Clinical Data: Endotracheal tube placement; patient unresponsive.  PORTABLE CHEST -  1 VIEW  Comparison: Chest radiograph performed 06/21/2008  Findings: The patient's endotracheal tube is seen ending 4-5 cm above the carina.  Vascular congestion is noted, with scattered bilateral atelectasis. No definite pulmonary edema is seen.  No pleural effusion or pneumothorax is identified.  The cardiomediastinal silhouette is borderline enlarged.  No acute osseous abnormalities are identified.  Postoperative change is noted overlying the cervical spine.  IMPRESSION:  1.  Endotracheal tube seen ending 4-5 cm above the carina. 2.  Vascular congestion and borderline cardiomegaly noted, with scattered bilateral atelectasis.   Original Report Authenticated By: Tonia Ghent, M.D.    No results found.   No diagnosis found. CRITICAL CARE Performed by: Donnetta Hutching   Total critical care time: 60  Critical care time was exclusive of separately billable procedures and treating other patients.  Critical care was necessary to treat or prevent imminent or life-threatening deterioration.  Critical care was time spent personally by me on the following activities: development of treatment plan with patient and/or surrogate as well as nursing, discussions with consultants, evaluation of patient's response to treatment, examination of patient, obtaining history from patient or surrogate, ordering and performing treatments and interventions, ordering and review of laboratory studies, ordering and review of radiographic studies, pulse oximetry and re-evaluation of patient's condition.   MDM   Poly-substance overdose. Blood pressure slightly low otherwise hemodynamically stable. IV is in place. Patient intubated without complications. Will attempt to transfer to [facility] to pulmonary  critical care    I personally performed the services described in this documentation, which was scribed in my presence. The recorded information has been reviewed and is accurate.          Donnetta Hutching, MD 10/04/12 [phone]

## 2012-10-04 NOTE — ED Notes (Signed)
MD at bedside for eval, intubation in progress.  ET - 7.5, 23 cm at lip, good bilateral breath sounds, equal rise and fall of chest.

## 2012-10-04 NOTE — ED Notes (Signed)
Apparent overdose of multiple medications, uncons unresponsive

## 2012-10-05 DIAGNOSIS — T50901A Poisoning by unspecified drugs, medicaments and biological substances, accidental (unintentional), initial encounter: Secondary | ICD-10-CM

## 2012-10-05 DIAGNOSIS — J9600 Acute respiratory failure, unspecified whether with hypoxia or hypercapnia: Secondary | ICD-10-CM

## 2012-10-05 DIAGNOSIS — G9340 Encephalopathy, unspecified: Secondary | ICD-10-CM

## 2012-10-05 DIAGNOSIS — I959 Hypotension, unspecified: Secondary | ICD-10-CM

## 2012-10-05 LAB — CBC
HCT: 38.2 % — ABNORMAL LOW (ref 39.0–52.0)
Hemoglobin: 12.2 g/dL — ABNORMAL LOW (ref 13.0–17.0)
MCH: 25.6 pg — ABNORMAL LOW (ref 26.0–34.0)
MCH: 25.9 pg — ABNORMAL LOW (ref 26.0–34.0)
MCHC: 31.9 g/dL (ref 30.0–36.0)
MCHC: 32 g/dL (ref 30.0–36.0)
MCV: 80.9 fL (ref 78.0–100.0)
Platelets: 221 10*3/uL (ref 150–400)
RBC: 4.71 MIL/uL (ref 4.22–5.81)
RDW: 15.4 % (ref 11.5–15.5)
RDW: 15.6 % — ABNORMAL HIGH (ref 11.5–15.5)

## 2012-10-05 LAB — GLUCOSE, CAPILLARY
Glucose-Capillary: 65 mg/dL — ABNORMAL LOW (ref 70–99)
Glucose-Capillary: 68 mg/dL — ABNORMAL LOW (ref 70–99)
Glucose-Capillary: 81 mg/dL (ref 70–99)
Glucose-Capillary: 85 mg/dL (ref 70–99)
Glucose-Capillary: 94 mg/dL (ref 70–99)
Glucose-Capillary: 94 mg/dL (ref 70–99)

## 2012-10-05 LAB — BASIC METABOLIC PANEL
BUN: 7 mg/dL (ref 6–23)
CO2: 22 mEq/L (ref 19–32)
Calcium: 8.8 mg/dL (ref 8.4–10.5)
Creatinine, Ser: 0.91 mg/dL (ref 0.50–1.35)
Creatinine, Ser: 1.04 mg/dL (ref 0.50–1.35)
GFR calc non Af Amer: >90 mL/min (ref >90)
GFR calc non Af Amer: 81 mL/min — ABNORMAL LOW (ref >90)
Glucose, Bld: 76 mg/dL (ref 70–99)
Glucose, Bld: 80 mg/dL (ref 70–99)
Potassium: 3.6 mEq/L (ref 3.5–5.1)
Sodium: 144 mEq/L (ref 135–145)

## 2012-10-05 LAB — PHOSPHORUS: Phosphorus: 3.9 mg/dL (ref 2.3–4.6)

## 2012-10-05 LAB — PROTIME-INR: Prothrombin Time: 14.1 seconds (ref 11.6–15.2)

## 2012-10-05 LAB — POCT I-STAT 3, ART BLOOD GAS (G3+)
Acid-base deficit: 6 mmol/L — ABNORMAL HIGH (ref 0.0–2.0)
Bicarbonate: 19.7 mEq/L — ABNORMAL LOW (ref 20.0–24.0)
Patient temperature: 97.1
TCO2: 21 mmol/L (ref 0–100)

## 2012-10-05 LAB — MAGNESIUM: Magnesium: 1.8 mg/dL (ref 1.5–2.5)

## 2012-10-05 LAB — TROPONIN I: Troponin I: <0.3 ng/mL (ref <0.30)

## 2012-10-05 LAB — APTT: aPTT: 29 seconds (ref 24–37)

## 2012-10-05 MED ORDER — HEPARIN SODIUM (PORCINE) 5000 UNIT/ML IJ SOLN
5000.0000 [IU] | Freq: Three times a day (TID) | INTRAMUSCULAR | Status: DC
  Administered 2012-10-05 – 2012-10-07 (×7): 5000 [IU] via SUBCUTANEOUS
  Filled 2012-10-05 (×10): qty 1

## 2012-10-05 MED ORDER — SODIUM CHLORIDE 0.9 % IV SOLN
INTRAVENOUS | Status: DC
  Administered 2012-10-05: 50 mL/h via INTRAVENOUS

## 2012-10-05 MED ORDER — LORAZEPAM 1 MG PO TABS
1.0000 mg | ORAL_TABLET | Freq: Four times a day (QID) | ORAL | Status: AC | PRN
  Administered 2012-10-07 (×3): 1 mg via ORAL
  Filled 2012-10-05 (×3): qty 1

## 2012-10-05 MED ORDER — THIAMINE HCL 100 MG/ML IJ SOLN
100.0000 mg | Freq: Every day | INTRAMUSCULAR | Status: DC
  Filled 2012-10-05 (×2): qty 1

## 2012-10-05 MED ORDER — DEXTROSE 50 % IV SOLN
INTRAVENOUS | Status: AC
  Filled 2012-10-05: qty 50

## 2012-10-05 MED ORDER — INSULIN ASPART 100 UNIT/ML ~~LOC~~ SOLN
0.0000 [IU] | SUBCUTANEOUS | Status: DC
  Administered 2012-10-07: 2 [IU] via SUBCUTANEOUS

## 2012-10-05 MED ORDER — LORAZEPAM 2 MG/ML IJ SOLN
1.0000 mg | Freq: Four times a day (QID) | INTRAMUSCULAR | Status: AC | PRN
  Administered 2012-10-06 (×2): 1 mg via INTRAVENOUS
  Filled 2012-10-05 (×2): qty 1

## 2012-10-05 MED ORDER — PROPOFOL 10 MG/ML IV EMUL
5.0000 ug/kg/min | INTRAVENOUS | Status: DC
  Administered 2012-10-05: 35 ug/kg/min via INTRAVENOUS
  Filled 2012-10-05: qty 100

## 2012-10-05 MED ORDER — FENTANYL CITRATE 0.05 MG/ML IJ SOLN: 50.0000 ug | INTRAMUSCULAR | Status: DC | PRN

## 2012-10-05 MED ORDER — DEXTROSE-NACL 5-0.9 % IV SOLN
INTRAVENOUS | Status: DC
  Administered 2012-10-05 – 2012-10-07 (×2): via INTRAVENOUS

## 2012-10-05 MED ORDER — SODIUM CHLORIDE 0.9 % IV SOLN
25.0000 ug/h | INTRAVENOUS | Status: DC
  Administered 2012-10-05: 50 ug/h via INTRAVENOUS
  Filled 2012-10-05 (×2): qty 50

## 2012-10-05 MED ORDER — ADULT MULTIVITAMIN W/MINERALS CH
1.0000 | ORAL_TABLET | Freq: Every day | ORAL | Status: DC
  Administered 2012-10-05 – 2012-10-10 (×6): 1 via ORAL
  Filled 2012-10-05 (×7): qty 1

## 2012-10-05 MED ORDER — SODIUM CHLORIDE 0.9 % IV SOLN: 250.0000 mL | INTRAVENOUS | Status: DC | PRN

## 2012-10-05 MED ORDER — DEXTROSE 50 % IV SOLN
25.0000 mL | Freq: Once | INTRAVENOUS | Status: AC | PRN
  Administered 2012-10-05: 25 mL via INTRAVENOUS

## 2012-10-05 MED ORDER — LORAZEPAM 2 MG/ML IJ SOLN
INTRAMUSCULAR | Status: AC
  Filled 2012-10-05: qty 1

## 2012-10-05 MED ORDER — VITAMIN B-1 100 MG PO TABS
100.0000 mg | ORAL_TABLET | Freq: Every day | ORAL | Status: DC
  Administered 2012-10-05 – 2012-10-10 (×6): 100 mg via ORAL
  Filled 2012-10-05 (×7): qty 1

## 2012-10-05 MED ORDER — LORAZEPAM 2 MG/ML IJ SOLN
2.0000 mg | Freq: Once | INTRAMUSCULAR | Status: AC
  Administered 2012-10-05: 2 mg via INTRAVENOUS

## 2012-10-05 MED ORDER — PANTOPRAZOLE SODIUM 40 MG IV SOLR
40.0000 mg | Freq: Every day | INTRAVENOUS | Status: DC
  Administered 2012-10-05 – 2012-10-06 (×3): 40 mg via INTRAVENOUS
  Filled 2012-10-05 (×5): qty 40

## 2012-10-05 MED ORDER — FENTANYL CITRATE 0.05 MG/ML IJ SOLN
25.0000 ug | INTRAMUSCULAR | Status: DC | PRN
  Administered 2012-10-05: 50 ug via INTRAVENOUS
  Filled 2012-10-05: qty 2

## 2012-10-05 MED ORDER — DEXMEDETOMIDINE HCL IN NACL 200 MCG/50ML IV SOLN
0.2000 ug/kg/h | INTRAVENOUS | Status: DC
  Administered 2012-10-05: 0.6 ug/kg/h via INTRAVENOUS
  Administered 2012-10-05: 0.4 ug/kg/h via INTRAVENOUS
  Administered 2012-10-05: 0.5 ug/kg/h via INTRAVENOUS
  Filled 2012-10-05 (×3): qty 50

## 2012-10-05 MED ORDER — FENTANYL BOLUS VIA INFUSION
25.0000 ug | Freq: Four times a day (QID) | INTRAVENOUS | Status: DC | PRN
  Filled 2012-10-05: qty 100

## 2012-10-05 MED ORDER — DEXTROSE 50 % IV SOLN
25.0000 mL | Freq: Once | INTRAVENOUS | Status: DC | PRN
  Filled 2012-10-05: qty 50

## 2012-10-05 MED ORDER — FOLIC ACID 1 MG PO TABS
1.0000 mg | ORAL_TABLET | Freq: Every day | ORAL | Status: DC
  Administered 2012-10-05 – 2012-10-10 (×6): 1 mg via ORAL
  Filled 2012-10-05 (×8): qty 1

## 2012-10-05 NOTE — Progress Notes (Signed)
eLink Physician-Brief Progress Note Patient Name: Gregory Payne DOB: July 22, 1961 MRN: [id number]  Date of Service  10/05/2012   HPI/Events of Note   CBGs low  eICU Interventions  IVF changed to D5 NS at 75cc/hr   Intervention Category Major Interventions:  (Hypoglycemia)  Shan Levans 10/05/2012, 5:15 PM

## 2012-10-05 NOTE — Progress Notes (Signed)
INITIAL NUTRITION ASSESSMENT  DOCUMENTATION CODES Per approved criteria  -Not Applicable   INTERVENTION:  If EN started, recommend Jevity 1.2 formula -- initiate at 20 ml/hr and increase by 10 ml every 4 hours to goal rate of 70 ml/hr with Prostat liquid protein 30 ml twice daily via tube to provide 2216 total kcals, 123 gm protein, 1356 ml of free water RD to follow for nutrition care plan  NUTRITION DIAGNOSIS: Inadequate oral intake related to inability to eat as evidenced by NPO status  Goal: Initiate EN support within next 24 hours if extubation not expected  Monitor:  EN initiation, respiratory status, weight, labs, I/O's  Reason for Assessment: VDRF  51 y.o. male  Admitting Dx: polysubstance overdose  ASSESSMENT: Patient is currently intubated on ventilator support MV: 13 Temp: 36.4 Propofol ---> stopped at 0900 AM  Patient with multiple psychiatric diagnoses found down next to empty bottles of Neurontin and Norvasc; drug screen was positive for Cocaine, Benzodiazepines and THC; intubated for airway protection and transferred to [facility] for further management; OGT in place.  Height: Ht Readings from Last 1 Encounters:  10/05/12 6\' 3"  (1.905 m)    Weight: Wt Readings from Last 1 Encounters:  10/05/12 214 lb 11.7 oz (97.4 kg)    Ideal Body Weight: 89 kg  % Ideal Body Weight: 109%  Wt Readings from Last 10 Encounters:  10/05/12 214 lb 11.7 oz (97.4 kg)  01/28/12 243 lb (110.224 kg)  01/28/12 243 lb (110.224 kg)  04/05/07 243 lb (110.224 kg)  03/07/07 247 lb (112.038 kg)  02/09/07 241 lb (109.317 kg)  01/26/07 247 lb (112.038 kg)  08/05/06 243 lb (110.224 kg)    Usual Body Weight: 243 lb  % Usual Body Weight: 88%  BMI:  Body mass index is 26.84 kg/(m^2).  Estimated Nutritional Needs: Kcal: 2100-2200 Protein: 120-130 gm Fluid: 2.1-2.2 L  Skin: Intact  Diet Order: NPO  EDUCATION NEEDS: -No education needs identified at this time   Intake/Output  Summary (Last 24 hours) at 10/05/12 1244 Last data filed at 10/05/12 1200  Gross per 24 hour  Intake    860 ml  Output   1075 ml  Net   -215 ml    Labs:   Lab 10/05/12 0330 10/05/12 0329 10/04/12 2233  NA 143 -- 146*  K 3.6 -- 3.6  CL 108 -- 109  CO2 19 -- 22  BUN 7 -- 6  CREATININE 0.91 -- 0.88  CALCIUM 8.6 -- 9.3  MG -- 1.8 --  PHOS -- 3.9 --  GLUCOSE 76 -- 78    CBG (last 3)   Basename 10/05/12 0925 10/05/12 0834 10/05/12 0418  GLUCAP 78 65* 81    Scheduled Meds:   . folic acid  1 mg Oral Daily  . heparin  5,000 Units Subcutaneous Q8H  . insulin aspart  0-15 Units Subcutaneous Q4H  . multivitamin with minerals  1 tablet Oral Daily  . pantoprazole (PROTONIX) IV  40 mg Intravenous QHS  . thiamine  100 mg Oral Daily   Or  . thiamine  100 mg Intravenous Daily    Continuous Infusions:   . sodium chloride 75 mL/hr at 10/05/12 0600  . dexmedetomidine 0.6 mcg/kg/hr (10/05/12 1200)  . propofol Stopped (10/05/12 0900)    Past Medical History  Diagnosis Date  . Diabetes mellitus   . Hypertension   . Hypercholesteremia   . Anxiety   . Depression   . Chronic pain   . Fibromyalgia   .  GERD (gastroesophageal reflux disease)   . Kidney stones   . Schizophrenia     Past Surgical History  Procedure Date  . Lithotripsy   . Hernia repair     left inguinal hernia  . Cervical disc surgery   . Esophagogastroduodenoscopy 01/28/2012    Procedure: ESOPHAGOGASTRODUODENOSCOPY (EGD);  Surgeon: Malissa Hippo, MD;  Location: [facility];  Service: Endoscopy;  Laterality: N/A;  1200    Kirkland Hun, RD, LDN Pager #: [phone] After-Hours Pager #: [phone]

## 2012-10-05 NOTE — Progress Notes (Signed)
eLink Physician-Brief Progress Note Patient Name: Gregory Payne DOB: 11-Jan-1961 MRN: [id number]  Date of Service  10/05/2012   HPI/Events of Note  Call from bedside nurse that patient is now responsive and agitated s/p drug OD.  eICU Interventions  Plan: Place on propofol cont sedation with prn fentanyl   Intervention Category Minor Interventions: Agitation / anxiety - evaluation and management  Rainen Vanrossum 10/05/2012, 3:47 AM

## 2012-10-05 NOTE — Progress Notes (Signed)
The patient seen and examined; admitted earlier today. 51 yo with paranoid schizophrenia, found down next to empty bottles of neurontin and norvasc. Drug screen was positive for Cocaine, Benzodiazepines and THC. Alcohol level 128. Intubated for airway protection and transferred to [facility] for further management.    Main issue this am agitation, encephalopathy. The patient has not tolerated propofol due to hypotension, he was initiated on Precedex which he tolerated for a brief period of time, subsequently developed bradycardia. We will continue close mental status monitoring; start Fentanyl drip; reassess for extubation in am 10/06/12.   Vanetta Mulders, MD  [facility]  [place], [place]   Pager [phone]

## 2012-10-05 NOTE — H&P (Signed)
PULMONARY  / CRITICAL CARE MEDICINE  Name: Gregory Payne MRN: [id number] DOB: 02/22/1961    LOS: 1  REFERRING MD:  Transfer from [facility] ED  CHIEF COMPLAINT:  Polysubstance overdose  BRIEF PATIENT DESCRIPTION: 51 yo with multiple psychiatric diagnoses found down next to empty bottles of  Neurontin and Norvasc.  Drug screen was positive for Cocaine, Benzodiazepines and THC.  Intubated for airway protection and transferred to [facility] for further management.  LINES / TUBES: OETT 12/25 >>> OGT 12/25 >>> Foley 12/25 >>>  CULTURES:  ANTIBIOTICS:  SIGNIFICANT EVENTS:  12/25  Found down, intubated for airway protection  LEVEL OF CARE:  ICU  PRIMARY SERVICE:  PCCM  CONSULTANTS:    CODE STATUS:  Full  DIET:  NPO  DVT Px:  Heparin  GI Px:  Protonix  The patient is encephalopathic and unable to provide history, which was obtained for available medical records.  HISTORY OF PRESENT ILLNESS:  51 yo with multiple psychiatric diagnoses found down next to empty bottles of  Neurontin and Norvasc.  Drug screen was positive for Cocaine, Benzodiazepines and THC.  Intubated for airway protection and transferred to [facility] for further management.  PAST MEDICAL HISTORY :  Past Medical History  Diagnosis Date  . Diabetes mellitus   . Hypertension   . Hypercholesteremia   . Anxiety   . Depression   . Chronic pain   . Fibromyalgia   . GERD (gastroesophageal reflux disease)   . Kidney stones   . Schizophrenia    Past Surgical History  Procedure Date  . Lithotripsy   . Hernia repair     left inguinal hernia  . Cervical disc surgery   . Esophagogastroduodenoscopy 01/28/2012    Procedure: ESOPHAGOGASTRODUODENOSCOPY (EGD);  Surgeon: Malissa Hippo, MD;  Location: [facility];  Service: Endoscopy;  Laterality: N/A;  1200   Prior to Admission medications   Medication Sig Start Date End Date Taking? Authorizing Provider  ALPRAZolam Prudy Feeler) 1 MG tablet Take 1 mg by mouth 3 (three) times daily  as needed.    Historical Provider, MD  amLODipine (NORVASC) 5 MG tablet Take 5 mg by mouth daily.    Historical Provider, MD  cephALEXin (KEFLEX) 500 MG capsule Take 500 mg by mouth 4 (four) times daily.    Historical Provider, MD  dexlansoprazole (DEXILANT) 60 MG capsule Take 60 mg by mouth daily.    Historical Provider, MD  docusate sodium (COLACE) 100 MG capsule Take 400 mg by mouth daily.    Historical Provider, MD  gabapentin (NEURONTIN) 300 MG capsule Take 600 mg by mouth 3 (three) times daily.    Historical Provider, MD  metFORMIN (GLUCOPHAGE) 500 MG tablet Take 500 mg by mouth 3 (three) times daily with meals.    Historical Provider, MD  OLANZapine (ZYPREXA) 15 MG tablet Take 15 mg by mouth at bedtime.    Historical Provider, MD  pravastatin (PRAVACHOL) 40 MG tablet Take 40 mg by mouth daily.    Historical Provider, MD   Allergies  Allergen Reactions  . Naproxen     REACTION: GI bleed from internal Hemmorhoids   FAMILY HISTORY:  Family History  Problem Relation Age of Onset  . Colon cancer Neg Hx    SOCIAL HISTORY:  reports that he has been smoking.  He does not have any smokeless tobacco history on file. He reports that he drinks alcohol. He reports that he uses illicit drugs (Marijuana).  REVIEW OF SYSTEMS:  Unable to provide.  INTERVAL HISTORY:  VITAL SIGNS: Temp:  [94.6 F (34.8 C)-97.1 F (36.2 C)] 97.1 F (36.2 C) (12/26 0400) Pulse Rate:  [73-89] 74  (12/26 0430) Resp:  [13-27] 16  (12/26 0430) BP: (76-141)/(47-95) 76/64 mmHg (12/26 0430) SpO2:  [97 %-100 %] 100 % (12/26 0430) FiO2 (%):  [40 %-100 %] 40 % (12/26 0400) Weight:  [97.4 kg (214 lb 11.7 oz)] 97.4 kg (214 lb 11.7 oz) (12/26 0211)  HEMODYNAMICS:    VENTILATOR SETTINGS: Vent Mode:  [-] PRVC FiO2 (%):  [40 %-100 %] 40 % Set Rate:  [14 bmp-16 bmp] 16 bmp Vt Set:  [500 mL-550 mL] 550 mL PEEP:  [5 cmH20] 5 cmH20 Plateau Pressure:  [14 cmH20-20 cmH20] 18 cmH20  INTAKE / OUTPUT: Intake/Output       12/25 0701 - 12/26 0700   I.V. (mL/kg) 100 (1)   NG/GT 60   IV Piggyback 10   Total Intake(mL/kg) 170 (1.7)   Urine (mL/kg/hr) 350 (0.1)   Other 500   Total Output 850   Net -680         PHYSICAL EXAMINATION: General:  Mechanically ventilated, synchronous Neuro:  Encephalopathic, nonfocal, cough / gag diminished HEENT:  PERRL, OETT / OGT Cardiovascular:  RRR, no m/r/g Lungs:  Bilateral diminished air entry, no w/r/r Abdomen:  Soft, nontender, bowel sounds diminished Musculoskeletal:  Moves all extremities, no edema Skin:  Intact  LABS:  Lab 10/05/12 0330 10/05/12 0329 10/04/12 2249 10/04/12 2233  HGB 12.2* -- -- 13.3  WBC 8.5 -- -- 6.8  PLT 216 -- -- 250  NA 143 -- -- 146*  K 3.6 -- -- 3.6  CL 108 -- -- 109  CO2 19 -- -- 22  GLUCOSE 76 -- -- 78  BUN 7 -- -- 6  CREATININE 0.91 -- -- 0.88  CALCIUM 8.6 -- -- 9.3  MG -- 1.8 -- --  PHOS -- 3.9 -- --  AST -- -- -- 16  ALT -- -- -- 14  ALKPHOS -- -- -- 82  BILITOT -- -- -- 0.3  PROT -- -- -- 7.5  ALBUMIN -- -- -- 3.8  APTT -- -- -- --  INR -- -- -- --  LATICACIDVEN -- -- -- --  TROPONINI -- -- -- <0.30  PROCALCITON -- -- -- --  PROBNP -- -- -- --  O2SATVEN -- -- -- --  PHART -- -- 7.351 --  PCO2ART -- -- 39.0 --  PO2ART -- -- 81.1 --    Lab 10/05/12 0418 10/05/12 0030  GLUCAP 81 85   IMAGING: 12/25  PCXR >>> Hardware in good position, no significant airspace disease  ECG: 12/26  Telemetry >>> nsr  DIAGNOSES: Active Problems:  Polysubstance overdose  Acute respiratory failure  Encephalopathy acute  ASSESSMENT / PLAN:  PULMONARY  A:  Acute respiratory failure due to inability to protect airway. P:   Gaol SpO2>92, pH>7.30 Full mechanical support Daily SBT Trend ABG / CXR  CARDIOVASCULAR  A: Transient hypotension secondary to sedation / Norvasc overdose.  Dyslipidemia.  HTN. P:  Goal MAP>60 Hold Norvasc Pravachol when able  RENAL  A:  No active issues. P:   Trend  BMP NS@75   GASTROINTESTINAL  A:  No active issues. P:   NPO as intubated TF if remains intubated > 24 hours  HEMATOLOGIC  A:  No active issues. P:  Trend CBC  INFECTIOUS  A:  No active issues. P:   No intervention required  ENDOCRINE   A:  DM.  P:   Hold Metformin SSI  NEUROLOGIC  A:  Acute encephalopathy.  Multiple psychiatric diagnoses.  Polysubstance overdose (THC, Benzo, Cocaine, Neurontin) P:   Goal RASS 0 to -1 Propofol gtt Hold Xanax , Neurontin, Zyprexa  CLINICAL SUMMARY: 51 yo with multiple psychiatric diagnoses found down next to empty bottles of  Neurontin and Norvasc.  Drug screen was positive for Cocaine, Benzodiazepines and THC.  Intubated for airway protection and transferred to [facility] for further management.  Full mechanical support.  SBT in AM.  Extubate if mental status improves.  May need psychiatric assessment.  I have personally obtained a history, examined the patient, evaluated laboratory and imaging results, formulated the assessment and plan and placed orders.  CRITICAL CARE:  The patient is critically ill with multiple organ systems failure and requires high complexity decision making for assessment and support, frequent evaluation and titration of therapies, application of advanced monitoring technologies and extensive interpretation of multiple databases. Critical Care Time devoted to patient care services described in this note is 35 minutes.   Lonia Farber, MD  Pulmonary and Critical Care Medicine [facility] Pager: [phone]  10/05/2012, 5:02 AM

## 2012-10-05 NOTE — Progress Notes (Signed)
Echocardiogram 2D Echocardiogram has been performed.  Gregory Payne 10/05/2012, 10:28 AM

## 2012-10-05 NOTE — Progress Notes (Signed)
Hypoglycemic Event  CBG: 64  Treatment: D50 IV 25 mL  Symptoms: None  Follow-up CBG: Time:2030 CBG Result:94  Possible Reasons for Event:   Comments/MD notified:NA    Gregory Payne  Remember to initiate Hypoglycemia Order Set & complete

## 2012-10-05 NOTE — Progress Notes (Signed)
eLink Physician-Brief Progress Note Patient Name: Gregory Payne DOB: November 16, 1960 MRN: [id number]  Date of Service  10/05/2012   HPI/Events of Note  Patient transferred from [facility] ED s/p intubation following drug ingestion including neurotin and norvasc.  UDS positive for THC/Cocaine/Benzos.  eICU Interventions  Plan: Admission orders placed Dr. Charleston Ropes to see   Intervention Category Minor Interventions: Routine modifications to care plan (e.g. PRN medications for pain, fever)  DETERDING,ELIZABETH 10/05/2012, 2:26 AM

## 2012-10-06 ENCOUNTER — Inpatient Hospital Stay (HOSPITAL_COMMUNITY): Payer: Medicare Other

## 2012-10-06 DIAGNOSIS — F200 Paranoid schizophrenia: Secondary | ICD-10-CM

## 2012-10-06 LAB — COMPREHENSIVE METABOLIC PANEL
AST: 27 U/L (ref 0–37)
BUN: 9 mg/dL (ref 6–23)
CO2: 23 mEq/L (ref 19–32)
Calcium: 8.5 mg/dL (ref 8.4–10.5)
Creatinine, Ser: 0.94 mg/dL (ref 0.50–1.35)
GFR calc Af Amer: >90 mL/min (ref >90)
GFR calc non Af Amer: >90 mL/min (ref >90)

## 2012-10-06 LAB — CBC
Hemoglobin: 12.6 g/dL — ABNORMAL LOW (ref 13.0–17.0)
MCH: 26 pg (ref 26.0–34.0)
RBC: 4.85 MIL/uL (ref 4.22–5.81)
WBC: 8.1 10*3/uL (ref 4.0–10.5)

## 2012-10-06 LAB — GLUCOSE, CAPILLARY
Glucose-Capillary: 91 mg/dL (ref 70–99)
Glucose-Capillary: 89 mg/dL (ref 70–99)
Glucose-Capillary: 85 mg/dL (ref 70–99)
Glucose-Capillary: 114 mg/dL — ABNORMAL HIGH (ref 70–99)

## 2012-10-06 LAB — MAGNESIUM: Magnesium: 1.7 mg/dL (ref 1.5–2.5)

## 2012-10-06 MED ORDER — PHENOL 1.4 % MT LIQD
1.0000 | OROMUCOSAL | Status: DC | PRN
  Administered 2012-10-07: 1 via OROMUCOSAL
  Filled 2012-10-06: qty 177

## 2012-10-06 NOTE — Procedures (Signed)
Extubation Procedure Note  Patient Details:   Name: Gregory Payne DOB: [birth year] MRN: [id number]   Airway Documentation:     Evaluation  O2 sats: stable throughout Complications: No apparent complications Patient did tolerate procedure well. Bilateral Breath Sounds: Clear;Diminished Suctioning: Oral Yes  Order to extubate per MD, positive for cuff leak. Extubated to 2lpm [place], no strider heard. Pt tolerated procedure well. Vitals are within normal limits. RT will continue to monitor.  Parke Poisson 10/06/2012, 11:46 AM

## 2012-10-06 NOTE — Progress Notes (Signed)
Wasted 200 ml fentanyl and 25 ml precedex, flushed down sink.   Demetria Pore, RN  Witnessed by Linton Flemings, RN

## 2012-10-06 NOTE — Progress Notes (Signed)
PULMONARY  / CRITICAL CARE MEDICINE  Name: Gregory Payne MRN: [id number] DOB: 12/10/1960    LOS: 2  REFERRING MD:  Transfer from [facility]  CHIEF COMPLAINT:  Polysubstance overdose  Brief history:  51 yo with multiple psychiatric diagnoses found down next to empty bottles of  Neurontin and Norvasc.  Drug screen was positive for Cocaine, Benzodiazepines and THC.  Intubated for airway protection and transferred to [facility] for further management. Extubated today 10/06/12.   Subjective: no acute complaints   SIGNIFICANT EVENTS:  12/25  Found down, intubated for airway protection 12/27 Extubated today   LEVEL OF CARE:  ICU  PRIMARY SERVICE:  PCCM  CONSULTANTS:  Psychiatry   CODE STATUS:  Full  DIET:  NPO  DVT Px:  Heparin  GI Px:  Protonix  VITAL SIGNS: Temp:  [97.2 F (36.2 C)-99.9 F (37.7 C)] 99.9 F (37.7 C) (12/27 1115) Pulse Rate:  [48-110] 94  (12/27 1200) Resp:  [12-20] 18  (12/27 1115) BP: (94-123)/(55-70) 115/59 mmHg (12/27 1200) SpO2:  [97 %-100 %] 97 % (12/27 1200) FiO2 (%):  [30 %-40 %] 30 % (12/27 0751) Weight:  [97.6 kg (215 lb 2.7 oz)] 97.6 kg (215 lb 2.7 oz) (12/27 0500)      VENTILATOR SETTINGS: Vent Mode:  [-] PSV;CPAP FiO2 (%):  [30 %-40 %] 30 % Set Rate:  [16 bmp] 16 bmp Vt Set:  [550 mL] 550 mL PEEP:  [5 cmH20] 5 cmH20 Pressure Support:  [5 cmH20] 5 cmH20 Plateau Pressure:  [13 cmH20-19 cmH20] 13 cmH20  INTAKE / OUTPUT: Intake/Output      12/26 0701 - 12/27 0700 12/27 0701 - 12/28 0700   I.V. (mL/kg) 2039.3 (20.9) 396.3 (4.1)   NG/GT 120 90   IV Piggyback 10    Total Intake(mL/kg) 2169.3 (22.2) 486.3 (5)   Urine (mL/kg/hr) 1200 (0.5) 400 (0.7)   Emesis/NG output 100    Other     Total Output 1300 400   Net +869.3 +86.3          PHYSICAL EXAMINATION: General:  Alert and appropriate  Neuro:  Non focal  HEENT:  PERRL  Cardiovascular:  RRR, no m/r/g Lungs:  Bilateral diminished air entry, no w/r/r Abdomen:  Soft, nontender, bowel  sounds diminished Musculoskeletal:  Moves all extremities, no edema Skin:  Intact  LABS:  Lab 10/06/12 0915 10/05/12 2032 10/05/12 1424 10/05/12 1158 10/05/12 1136 10/05/12 0524 10/05/12 0330 10/05/12 0329 10/04/12 2249 10/04/12 2233  HGB 12.6* -- -- -- 12.2* -- 12.2* -- -- --  WBC 8.1 -- -- -- 8.5 -- 8.5 -- -- --  PLT 221 -- -- -- 221 -- 216 -- -- --  NA 142 -- -- -- 144 -- 143 -- -- --  K 3.6 -- -- -- 4.1 -- -- -- -- --  CL 108 -- -- -- 110 -- 108 -- -- --  CO2 23 -- -- -- 22 -- 19 -- -- --  GLUCOSE 78 -- -- -- 80 -- 76 -- -- --  BUN 9 -- -- -- 11 -- 7 -- -- --  CREATININE 0.94 -- -- -- 1.04 -- 0.91 -- -- --  CALCIUM 8.5 -- -- -- 8.8 -- 8.6 -- -- --  MG 1.7 -- -- -- -- -- -- 1.8 -- --  PHOS -- -- -- -- -- -- -- 3.9 -- --  AST 27 -- -- -- -- -- -- -- -- 16  ALT 14 -- -- -- -- -- -- -- --  14  ALKPHOS 76 -- -- -- -- -- -- -- -- 82  BILITOT 0.5 -- -- -- -- -- -- -- -- 0.3  PROT 6.4 -- -- -- -- -- -- -- -- 7.5  ALBUMIN 3.1* -- -- -- -- -- -- -- -- 3.8  APTT -- -- -- -- 29 -- -- -- -- --  INR -- -- -- -- 1.10 -- -- -- -- --  LATICACIDVEN -- -- -- -- 1.1 -- -- -- -- --  TROPONINI -- <0.30 <0.30 <0.30 -- -- -- -- -- --  PROCALCITON -- -- -- -- -- -- -- -- -- --  PROBNP -- -- -- -- -- -- -- -- -- --  O2SATVEN -- -- -- -- -- -- -- -- -- --  PHART -- -- -- -- -- 7.304* -- -- 7.351 --  PCO2ART -- -- -- -- -- 39.2 -- -- 39.0 --  PO2ART -- -- -- -- -- 160.0* -- -- 81.1 --    Lab 10/06/12 1113 10/06/12 0718 10/06/12 0417 10/05/12 2300 10/05/12 2037  GLUCAP 93 89 91 83 94   IMAGING: 12/27  CXR with no acute disease  ECG: 12/26  Telemetry >>> nsr  DIAGNOSES: Active Problems:  Polysubstance overdose  Acute respiratory failure  Encephalopathy acute  ASSESSMENT / PLAN:  PULMONARY  A:  Acute respiratory failure due to inability to protect airway. P:   Passed SBT; extubated today; cont to monitor in the ICU for 24h.   CARDIOVASCULAR  A: Transient hypotension secondary to  sedation / Norvasc overdose.  Dyslipidemia.  HTN. P:  Hold Norvasc Pravachol when able  RENAL  A:  No active issues. P:   Trend BMP NS@75   GASTROINTESTINAL A:  No active issues. P:   NPO for now;  Bedside swallow evaluation     HEMATOLOGIC A:  No active issues. P:  Trend CBC  INFECTIOUS A:  No active issues. No intervention required  ENDOCRINE  A:  DM.  Hold Metformin SSI  NEUROLOGIC A:  Acute encephalopathy.  Multiple psychiatric diagnoses.  Polysubstance overdose (THC, Benzo, Cocaine, Neurontin) P:   Goal RASS 0 to -1 Propofol gtt Hold Xanax , Neurontin, Zyprexa  Psychiatric; h/o Schizophrenia  Suicidal precautions  Sitter at the bedside  Consulted psychiatry for evaluation   CLINICAL SUMMARY: 51 yo with multiple psychiatric diagnoses found down next to empty bottles of  Neurontin and Norvasc.  Drug screen was positive for Cocaine, Benzodiazepines and THC.  Intubated for airway protection and transferred to [facility] for further management.Extubated today 10/06/12. Psychiatric consult called today.   I have personally obtained a history, examined the patient, evaluated laboratory and imaging results, formulated the assessment and plan and placed orders.  CRITICAL CARE:  The patient is critically ill with multiple organ systems failure and requires high complexity decision making for assessment and support, frequent evaluation and titration of therapies, application of advanced monitoring technologies and extensive interpretation of multiple databases. Critical Care Time devoted to patient care services described in this note is 35 minutes.   Vanetta Mulders, MD  Pulmonary and Critical Care Medicine [facility] Pager: [phone]  10/06/2012, 12:51 PM

## 2012-10-07 DIAGNOSIS — F411 Generalized anxiety disorder: Secondary | ICD-10-CM

## 2012-10-07 LAB — CULTURE, RESPIRATORY W GRAM STAIN: Special Requests: NORMAL

## 2012-10-07 LAB — BASIC METABOLIC PANEL
BUN: 7 mg/dL (ref 6–23)
CO2: 24 mEq/L (ref 19–32)
Calcium: 9 mg/dL (ref 8.4–10.5)
Chloride: 105 mEq/L (ref 96–112)
Creatinine, Ser: 0.86 mg/dL (ref 0.50–1.35)
Creatinine, Ser: 0.93 mg/dL (ref 0.50–1.35)
GFR calc Af Amer: >90 mL/min (ref >90)
GFR calc non Af Amer: >90 mL/min (ref >90)
Potassium: 3.8 mEq/L (ref 3.5–5.1)

## 2012-10-07 LAB — CBC
MCV: 81.1 fL (ref 78.0–100.0)
MCV: 81.2 fL (ref 78.0–100.0)
Platelets: 204 10*3/uL (ref 150–400)
Platelets: 209 10*3/uL (ref 150–400)
RBC: 4.53 MIL/uL (ref 4.22–5.81)
RDW: 15.3 % (ref 11.5–15.5)
RDW: 15.3 % (ref 11.5–15.5)
WBC: 9.5 10*3/uL (ref 4.0–10.5)
WBC: 8.1 10*3/uL (ref 4.0–10.5)

## 2012-10-07 LAB — GLUCOSE, CAPILLARY
Glucose-Capillary: 82 mg/dL (ref 70–99)
Glucose-Capillary: 91 mg/dL (ref 70–99)

## 2012-10-07 MED ORDER — PANTOPRAZOLE SODIUM 40 MG PO TBEC
40.0000 mg | DELAYED_RELEASE_TABLET | Freq: Every day | ORAL | Status: DC
  Administered 2012-10-07 – 2012-10-10 (×3): 40 mg via ORAL
  Filled 2012-10-07 (×3): qty 1

## 2012-10-07 MED ORDER — INSULIN ASPART 100 UNIT/ML ~~LOC~~ SOLN: 0.0000 [IU] | Freq: Three times a day (TID) | SUBCUTANEOUS | Status: DC

## 2012-10-07 MED ORDER — PNEUMOCOCCAL VAC POLYVALENT 25 MCG/0.5ML IJ INJ
0.5000 mL | INJECTION | INTRAMUSCULAR | Status: AC
  Filled 2012-10-07: qty 0.5

## 2012-10-07 MED ORDER — NICOTINE 21 MG/24HR TD PT24
21.0000 mg | MEDICATED_PATCH | Freq: Every day | TRANSDERMAL | Status: DC
  Administered 2012-10-07 – 2012-10-10 (×4): 21 mg via TRANSDERMAL
  Filled 2012-10-07 (×5): qty 1

## 2012-10-07 MED ORDER — INFLUENZA VIRUS VACC SPLIT PF IM SUSP
0.5000 mL | INTRAMUSCULAR | Status: AC
  Filled 2012-10-07: qty 0.5

## 2012-10-07 NOTE — Progress Notes (Signed)
PULMONARY  / CRITICAL CARE MEDICINE  Name: Gregory Payne MRN: [id number] DOB: 05/13/1961    LOS: 3  REFERRING MD:  [facility] from [facility]  CHIEF COMPLAINT:  Polysubstance overdose  Brief history:  51 yo with multiple psychiatric diagnoses found down next to empty bottles of  Neurontin and Norvasc.  Drug screen was positive for Cocaine, Benzodiazepines and THC.  Intubated for airway protection and transferred to [facility] for further management. Extubated today 10/06/12.   Subjective: no acute complaints   SIGNIFICANT EVENTS:  12/25  Found down, intubated for airway protection 12/27 Extubated   LEVEL OF CARE:  ICU >> to floor 12/28 PRIMARY SERVICE:  PCCM >> to [facility] 12/29 CONSULTANTS:  Psychiatry  CODE STATUS:  Full DIET:  Regular DVT Px:  Heparin GI Px:  Protonix  VITAL SIGNS: Temp:  [98.7 F (37.1 C)-100.3 F (37.9 C)] 98.8 F (37.1 C) (12/28 0715) Pulse Rate:  [80-101] 80  (12/28 1000) Resp:  [13-20] 15  (12/28 1000) BP: (106-128)/(59-77) 113/65 mmHg (12/28 1000) SpO2:  [96 %-100 %] 97 % (12/28 1000) Weight:  [97.2 kg (214 lb 4.6 oz)] 97.2 kg (214 lb 4.6 oz) (12/28 0500) Room air    INTAKE / OUTPUT: Intake/Output      12/27 0701 - 12/28 0700 12/28 0701 - 12/29 0700   I.V. (mL/kg) 1821.3 (18.7) 225 (2.3)   NG/GT 90    IV Piggyback 10    Total Intake(mL/kg) 1921.3 (19.8) 225 (2.3)   Urine (mL/kg/hr) 2550 (1.1) 225 (0.5)   Emesis/NG output     Total Output 2550 225   Net -628.8 0        Stool Occurrence 1 x      PHYSICAL EXAMINATION: General:  Alert and appropriate  Neuro:  Non focal  HEENT:  PERRL  Cardiovascular:  RRR, no m/r/g Lungs:  Bilateral diminished air entry, no w/r/r Abdomen:  Soft, nontender, bowel sounds diminished Musculoskeletal:  Moves all extremities, no edema Skin:  Intact  LABS:  Lab 10/07/12 0445 10/06/12 0915 10/05/12 1136  NA 139 142 144  K 3.8 3.6 4.1  CL 105 108 110  CO2 24 23 22   BUN 7 9 11   CREATININE 0.86 0.94 1.04    GLUCOSE 84 78 80    Lab 10/07/12 0445 10/06/12 0915 10/05/12 1136  HGB 12.4* 12.6* 12.2*  HCT 38.6* 39.8 38.1*  WBC 9.5 8.1 8.5  PLT 204 221 221      Lab 10/07/12 0713 10/07/12 0345 10/06/12 2329 10/06/12 2005 10/06/12 1625  GLUCAP 82 84 147* 114* 85   IMAGING: 12/27  CXR with no acute disease  ECG: 12/26  Telemetry >>> nsr  DIAGNOSES: Active Problems:  Polysubstance overdose  Acute respiratory failure (resolved)  Encephalopathy acute (resolved)  ASSESSMENT / PLAN:  Polysubstance overdose (THC, Benzo, Cocaine, Neurontin) Acute encephalopathy (resolved).   Multiple psychiatric diagnoses.  Psychiatric; h/o Schizophrenia  Suicidal precautions  Sitter at the bedside  Consulted psychiatry for evaluation  Think he would benefit from inpt psych  Watch for evidence of w/d  DM.  Hold Metformin SSI   CLINICAL SUMMARY: 51 yo with multiple psychiatric diagnoses found down next to empty bottles of  Neurontin and Norvasc.  Drug screen was positive for Cocaine, Benzodiazepines and THC.  Intubated for airway protection and transferred to [facility] for further management.Extubated  10/06/12. Psychiatric consult called and awaiting eval.   [facility] will assume care as of 12/29   Reviewed above, examined pt, and agree with  assessment/plan.  Transfer out of ICU.  [facility] to assume care 12/29 and PCCM sign off.  Coralyn Helling, MD [facility] Pulmonary/Critical Care 10/07/2012, 11:50 AM Pager:  [phone] After 3pm call: [phone]

## 2012-10-07 NOTE — Progress Notes (Signed)
Report called to RN and met RN at bedside.  Patient transferred to 6N- 28 without complications.

## 2012-10-08 LAB — GLUCOSE, CAPILLARY
Glucose-Capillary: 81 mg/dL (ref 70–99)
Glucose-Capillary: 106 mg/dL — ABNORMAL HIGH (ref 70–99)

## 2012-10-08 MED ORDER — LORAZEPAM 1 MG PO TABS
1.0000 mg | ORAL_TABLET | Freq: Three times a day (TID) | ORAL | Status: DC | PRN
  Administered 2012-10-08 – 2012-10-10 (×5): 1 mg via ORAL
  Filled 2012-10-08 (×6): qty 1

## 2012-10-08 NOTE — Clinical Social Work Psych Note (Signed)
Psych CSW consult for SI, SA, and possible inpt psych need. Await psych MD eval and recommendations. Weekday psych CSW to f/u.  Dellie Burns, MSW, LCSWA [phone] (Weekends 8:00am-4:30pm)

## 2012-10-08 NOTE — Progress Notes (Signed)
[facility] PROGRESS NOTE  Gregory Payne ZOX:[id number] DOB: Nov 06, 1960 DOA: 10/04/2012 PCP: Isabella Stalling, MD  Assessment/Plan: Active Problems:  Polysubstance overdose  Acute respiratory failure (resolved)  Encephalopathy acute (resolved)    Polysubstance abuse awaiting psychiatric consultation, may need transfer to inpatient psych,Psychiatric consult called and awaiting eval   Acute encephalopathy secondary to polysubstance overdose now resolved  Acute respiratory failure requiring intubation now stable  Diabetes mellitus holding metformin and continue sliding scale insulin   Code Status: full Family Communication: family updated about patient's clinical progress Disposition Plan:  As above    Brief narrative: 51 yo with multiple psychiatric diagnoses found down next to empty bottles of Neurontin and Norvasc. Drug screen was positive for Cocaine, Benzodiazepines and THC. Intubated for airway protection and transferred to [facility] for further management. Extubated today 10/06/12.    Consultants:  Critical care  Psychiatry  Procedures: SIGNIFICANT EVENTS:  12/25 Found down, intubated for airway protection  12/27 Extubated  LEVEL OF CARE: ICU >> to floor 12/28  PRIMARY SERVICE: PCCM >> to [facility] 12/29  CONSULTANTS: Psychiatry  CODE STATUS: Full  DIET: Regular  DVT Px: Heparin  GI Px: Protonix   Antibiotics:  None  HPI/Subjective: Stable overnight  Objective: Filed Vitals:   10/07/12 1909 10/07/12 2204 10/08/12 0201 10/08/12 0626  BP: 137/79 140/82 141/77 140/64  Pulse: 81 93 84 74  Temp: 98.8 F (37.1 C) 99.6 F (37.6 C) 98.6 F (37 C) 99.4 F (37.4 C)  TempSrc: Oral Oral    Resp: 20 21 20 19   Height:      Weight:      SpO2: 99% 95% 97% 97%    Intake/Output Summary (Last 24 hours) at 10/08/12 0948 Last data filed at 10/08/12 0848  Gross per 24 hour  Intake    675 ml  Output    100 ml  Net    575 ml    Exam:  General: Alert  and appropriate  Neuro: Non focal  HEENT: PERRL  Cardiovascular: RRR, no m/r/g  Lungs: Bilateral diminished air entry, no w/r/r  Abdomen: Soft, nontender, bowel sounds diminished  Musculoskeletal: Moves all extremities, no edema  Skin: Intact    Data Reviewed: Basic Metabolic Panel:  Lab 10/07/12 8119 10/07/12 0445 10/06/12 0915 10/05/12 1136 10/05/12 0330 10/05/12 0329  NA 136 139 142 144 143 --  K 3.7 3.8 3.6 4.1 3.6 --  CL 102 105 108 110 108 --  CO2 24 24 23 22 19  --  GLUCOSE 80 84 78 80 76 --  BUN 7 7 9 11 7  --  CREATININE 0.93 0.86 0.94 1.04 0.91 --  CALCIUM 9.0 8.6 8.5 8.8 8.6 --  MG -- -- 1.7 -- -- 1.8  PHOS -- -- -- -- -- 3.9    Liver Function Tests:  Lab 10/06/12 0915 10/04/12 2233  AST 27 16  ALT 14 14  ALKPHOS 76 82  BILITOT 0.5 0.3  PROT 6.4 7.5  ALBUMIN 3.1* 3.8    Lab 10/04/12 2233  LIPASE 75*  AMYLASE --   No results found for this basename: AMMONIA:5 in the last 168 hours  CBC:  Lab 10/07/12 1223 10/07/12 0445 10/06/12 0915 10/05/12 1136 10/05/12 0330 10/04/12 2233  WBC 8.1 9.5 8.1 8.5 8.5 --  NEUTROABS -- -- -- -- -- 2.7  HGB 11.4* 12.4* 12.6* 12.2* 12.2* --  HCT 36.8* 38.6* 39.8 38.1* 38.2* --  MCV 81.2 81.1 82.1 80.9 80.3 --  PLT 209 204 221 221  216 --    Cardiac Enzymes:  Lab 10/05/12 2032 10/05/12 1424 10/05/12 1158 10/04/12 2233  CKTOTAL -- -- -- --  CKMB -- -- -- --  CKMBINDEX -- -- -- --  TROPONINI <0.30 <0.30 <0.30 <0.30   BNP (last 3 results) No results found for this basename: PROBNP:3 in the last 8760 hours   CBG:  Lab 10/08/12 0809 10/07/12 2206 10/07/12 1747 10/07/12 1201 10/07/12 0713  GLUCAP 81 120* 115* 91 82    Recent Results (from the past 240 hour(s))  MRSA PCR SCREENING     Status: Normal   Collection Time   10/05/12  2:42 AM      Component Value Range Status Comment   MRSA by PCR NEGATIVE  NEGATIVE Final   CULTURE, RESPIRATORY     Status: Normal   Collection Time   10/05/12 11:24 AM      Component  Value Range Status Comment   Specimen Description TRACHEAL ASPIRATE   Final    Special Requests Normal   Final    Gram Stain     Final    Value: RARE WBC PRESENT, PREDOMINANTLY PMN     RARE SQUAMOUS EPITHELIAL CELLS PRESENT     RARE GRAM POSITIVE RODS     RARE GRAM NEGATIVE RODS   Culture Non-Pathogenic Oropharyngeal-type Flora Isolated.   Final    Report Status 10/07/2012 FINAL   Final   CULTURE, BLOOD (ROUTINE X 2)     Status: Normal (Preliminary result)   Collection Time   10/05/12 11:30 AM      Component Value Range Status Comment   Specimen Description BLOOD HAND RIGHT   Final    Special Requests BOTTLES DRAWN AEROBIC AND ANAEROBIC 10CC   Final    Culture  Setup Time 10/05/2012 16:36   Final    Culture     Final    Value:        BLOOD CULTURE RECEIVED NO GROWTH TO DATE CULTURE WILL BE HELD FOR 5 DAYS BEFORE ISSUING A FINAL NEGATIVE REPORT   Report Status PENDING   Incomplete   CULTURE, BLOOD (ROUTINE X 2)     Status: Normal (Preliminary result)   Collection Time   10/05/12 11:45 AM      Component Value Range Status Comment   Specimen Description BLOOD HAND RIGHT   Final    Special Requests BOTTLES DRAWN AEROBIC AND ANAEROBIC 10CC   Final    Culture  Setup Time 10/05/2012 16:36   Final    Culture     Final    Value:        BLOOD CULTURE RECEIVED NO GROWTH TO DATE CULTURE WILL BE HELD FOR 5 DAYS BEFORE ISSUING A FINAL NEGATIVE REPORT   Report Status PENDING   Incomplete      Studies: Portable Chest Xray In Am  10/06/2012  *RADIOLOGY REPORT*  Clinical Data: Evaluate lung fields/endotracheal tube position  PORTABLE CHEST - 1 VIEW  Comparison: 09/14/2012  Findings: Endotracheal tube above the thoracic inlet.  Advancement at least 3 cm is suggested.  Enteric tube terminates in the gastric cardia.  Cardiomegaly.  No frank interstitial edema.  Mild patchy bilateral lower lobe opacities, possibly atelectasis.  IMPRESSION: Endotracheal tube above the thoracic inlet.  Advancement at  least 3 cm is suggested.  Mild patchy bilateral lower lobe opacities, possibly atelectasis.   Original Report Authenticated By: Charline Bills, M.D.    Dg Chest Port 1 View  10/04/2012  *RADIOLOGY REPORT*  Clinical Data: Endotracheal tube placement; patient unresponsive.  PORTABLE CHEST - 1 VIEW  Comparison: Chest radiograph performed 06/21/2008  Findings: The patient's endotracheal tube is seen ending 4-5 cm above the carina.  Vascular congestion is noted, with scattered bilateral atelectasis. No definite pulmonary edema is seen.  No pleural effusion or pneumothorax is identified.  The cardiomediastinal silhouette is borderline enlarged.  No acute osseous abnormalities are identified.  Postoperative change is noted overlying the cervical spine.  IMPRESSION:  1.  Endotracheal tube seen ending 4-5 cm above the carina. 2.  Vascular congestion and borderline cardiomegaly noted, with scattered bilateral atelectasis.   Original Report Authenticated By: Tonia Ghent, M.D.     Scheduled Meds:   . folic acid  1 mg Oral Daily  . influenza  inactive virus vaccine  0.5 mL Intramuscular Tomorrow-1000  . insulin aspart  0-9 Units Subcutaneous TID WC  . multivitamin with minerals  1 tablet Oral Daily  . nicotine  21 mg Transdermal Q1200  . pantoprazole  40 mg Oral Q1200  . pneumococcal 23 valent vaccine  0.5 mL Intramuscular Tomorrow-1000  . thiamine  100 mg Oral Daily   Continuous Infusions:   Active Problems:  Polysubstance overdose  Acute respiratory failure (resolved)  Encephalopathy acute (resolved)    Time spent: 40 minutes   [facility]  [facility] Pager [phone]. If 8PM-8AM, please contact night-coverage at [email or web address], password [facility] 10/08/2012, 9:48 AM  LOS: 4 days

## 2012-10-08 NOTE — Progress Notes (Signed)
Patient refuses scd's. Stated no need to hook them up, "he wasn't wearing em".

## 2012-10-09 LAB — GLUCOSE, CAPILLARY: Glucose-Capillary: 100 mg/dL — ABNORMAL HIGH (ref 70–99)

## 2012-10-09 MED ORDER — ALPRAZOLAM 0.5 MG PO TABS
1.0000 mg | ORAL_TABLET | Freq: Once | ORAL | Status: AC
  Administered 2012-10-09: 1 mg via ORAL
  Filled 2012-10-09: qty 2

## 2012-10-09 MED ORDER — LORAZEPAM 1 MG PO TABS
1.0000 mg | ORAL_TABLET | Freq: Once | ORAL | Status: AC
  Administered 2012-10-09: 1 mg via ORAL

## 2012-10-09 MED FILL — Medication: Qty: 1 | Status: AC
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
## 2012-10-09 NOTE — Progress Notes (Signed)
Clinical Social Work Department CLINICAL SOCIAL WORK PSYCHIATRY SERVICE LINE ASSESSMENT 10/09/2012  Patient:  Gregory Payne  Account:  [id number]  Admit Date:  10/04/2012  Clinical Social Worker:  Unk Lightning, LCSW  Date/Time:  10/09/2012 01:00 PM Referred by:  Physician  Date referred:  10/09/2012 Reason for Referral  Behavioral Health Issues   Presenting Symptoms/Problems (In the person's/family's own words):   "I tried to take myself out of this world."    Psych consulted for OD   Abuse/Neglect/Trauma History (check all that apply)  Denies history   Abuse/Neglect/Trauma Comments:   Psychiatric History (check all that apply)  Outpatient treatment  Inpatient/hospitilization   Psychiatric medications:  Zyprexa and Xanax   Current Mental Health Hospitalizations/Previous Mental Health History:   Patient reports he was going to [facility] but his psychiatrist got fired. Patient reports he was seeing a different psychiatrist but her office closed down. PCP prescribes medications now. Patient attends "[facility]" support group.   Current provider:   PCP   Place and Date:   N/A   Current Medications:               . folic acid  1 mg Oral Daily  . insulin aspart  0-9 Units Subcutaneous TID WC  . multivitamin with minerals  1 tablet Oral Daily  . nicotine  21 mg Transdermal Q1200  . pantoprazole  40 mg Oral Q1200  . thiamine  100 mg Oral Daily   Previous Impatient Admission/Date/Reason:   Patient reports he was at [facility] several years ago.   Emotional Health / Current Symptoms    Suicide/Self Harm  Suicide attempt in past (date/description)   Suicide attempt in the past:   Patient admitted due to attempted overdose. Patient reports when he was 19 he attempted suicide by overdosing on pills. Patient denies any SI or HI currently.   Other harmful behavior:   Psychotic/Dissociative Symptoms  Other - See comment   Other Psychotic/Dissociative Symptoms:   Patient  reports he has auditory and visual hallucinations when he is very tired.    Attention/Behavioral Symptoms  Within Normal Limits   Other Attention / Behavioral Symptoms:    Cognitive Impairment  Within Normal Limits   Other Cognitive Impairment:    Mood and Adjustment  Mood Congruent    Stress, Anxiety, Trauma, Any Recent Loss/Stressor  Grief/Loss (recent or history)   Anxiety (frequency):   Phobia (specify):   Compulsive behavior (specify):   Obsessive behavior (specify):   Other:   Patient's mother recently passed away. Patient broke up with girlfriend. Patient was evicted and had to move. Patient's car broke down.   Substance Abuse/Use  Current substance use   SBIRT completed (please refer for detailed history):  Y  Self-reported substance use:   Patient reports he drinks 1 glass of wine at dinner a night. Patient reports he takes "1 hit of marijuana every once in awhile."   Urinary Drug Screen Completed:  Y Alcohol level:   Positive for benzos, cocaine and THC. Patient denies any cocaine use.    Environmental/Housing/Living Arrangement  Stable housing   Who is in the home:   Alone   Emergency contact:  Music therapist  Medicare  Medicaid   Patient's Strengths and Goals (patient's own words):   "I know how to stay busy and I do things on my own."   Clinical Social Worker's Interpretive Summary:   CSW received referral to complete psychosocial assessment. CSW reviewed chart and met with  patient at bedside. Sitter present but no visitors in room.    CSW introduced myself and explained role. Patient agreeable to assessment. Patient reports he tried to kill himself by overdosing on his psychotropic medications. CSW asked patient to identify triggers and patient reported that mother passed away and broke up with his girlfriend. Patient recently had to move due to being evicted and reports he does not like his new apartment. Patient reports that he has  had to change providers multiple times and is not as connected to the community as he needs to be. Patient reports he needs help such as medication management.    Patient reports he was diagnosed with paranoid schizophrenia in 7. Patient reports that he has followed up with care but feels he does best when he has things to do in the community. Patient did well reflecting on his past and acknowledging that support groups and day programs are helpful for him. Patient states that he is willing to do whatever psych MD recommends because he wants to feel better.    Patient reports that he was feeling hopeless around the holidays but regrets trying to hurt himself. Patient reports that he now knows that "you gotta have bad times to make the good times worth having." Patient denies any SI or HI and reports that "others have it a lot worse than me." Patient reports that he does have auditory and visual hallucinations when tired or not rested well. Patient denies any psychotic symptoms this hospitalization.    Patient agreeable to discuss substance use and completed SBIRT. Patient reports he drinks 1 glass of wine daily and smokes marijuana occasionally. Patient appears to minimize drug use and denies any cocaine use although he tested positive. Patient reports he is aware of negative side effects from drug use and reports that he is sometimes more depressed after drinking alcohol. Patient reports he used to consume more alcohol to self medicate but now that he has pain medication he does not drink as often. Patient reports he does not have any problems with substance use and declines outpatient treatment.    Patient was engaged throughout assessment. Patient had appropriate behavior and did well reflecting on MH diagnosis and treatment. Patient appears to be remorseful over attempted OD. Patient shows good insight in recognizing the positive aspects in life and wanted to redirect his hopelessness to successes he  has had in life. Patient is open and agreeable to psych MD recommendations.    CSW staffed case with psych MD and will follow up after recommendations provided.   Disposition:  Recommend Psych CSW continuing to support while in hospital

## 2012-10-09 NOTE — Progress Notes (Signed)
[facility] PROGRESS NOTE  Gregory Payne NWG:[id number] DOB: 1961-08-06 DOA: 10/04/2012 PCP: Isabella Stalling, MD  Assessment/Plan: Active Problems:  Polysubstance overdose  Acute respiratory failure (resolved)  Encephalopathy acute (resolved)    Polysubstance abuse awaiting psychiatric consultation, may need transfer to inpatient psych,Psychiatric consult called and awaiting eval  Acute encephalopathy secondary to polysubstance overdose now resolved  Acute respiratory failure requiring intubation now stable  Diabetes mellitus holding metformin and continue sliding scale insulin  Code Status: full  Family Communication: family updated about patient's clinical progress  Disposition Plan: As above   Brief narrative:  51 yo with multiple psychiatric diagnoses found down next to empty bottles of Neurontin and Norvasc. Drug screen was positive for Cocaine, Benzodiazepines and THC. Intubated for airway protection and transferred to [facility] for further management. Extubated today 10/06/12.  Consultants:  Critical care  Psychiatry Procedures:  SIGNIFICANT EVENTS:  12/25 Found down, intubated for airway protection  12/27 Extubated  LEVEL OF CARE: ICU >> to floor 12/28  PRIMARY SERVICE: PCCM >> to [facility] 12/29  CONSULTANTS: Psychiatry  CODE STATUS: Full  DIET: Regular  DVT Px: Heparin  GI Px: Protonix  Antibiotics:  None HPI/Subjective:  Stable overnight    Objective: Filed Vitals:   10/08/12 0626 10/08/12 1445 10/08/12 2151 10/09/12 0527  BP: 140/64 134/78 130/81 126/69  Pulse: 74 77 73 106  Temp: 99.4 F (37.4 C) 98.3 F (36.8 C) 98 F (36.7 C) 99.1 F (37.3 C)  TempSrc:  Oral Oral   Resp: 19 20 20 20   Height:      Weight:      SpO2: 97% 99% 98% 96%    Intake/Output Summary (Last 24 hours) at 10/09/12 1028 Last data filed at 10/09/12 0925  Gross per 24 hour  Intake    600 ml  Output    225 ml  Net    375 ml    Exam:  HENT:  Head: Atraumatic.    Nose: Nose normal.  Mouth/Throat: Oropharynx is clear and moist.  Eyes: Conjunctivae are normal. Pupils are equal, round, and reactive to light. No scleral icterus.  Neck: Neck supple. No tracheal deviation present.  Cardiovascular: Normal rate, regular rhythm, normal heart sounds and intact distal pulses.  Pulmonary/Chest: Effort normal and breath sounds normal. No respiratory distress.  Abdominal: Soft. Normal appearance and bowel sounds are normal. She exhibits no distension. There is no tenderness.  Musculoskeletal: She exhibits no edema and no tenderness.  Neurological: She is alert. No cranial nerve deficit.    Data Reviewed: Basic Metabolic Panel:  Lab 10/07/12 5784 10/07/12 0445 10/06/12 0915 10/05/12 1136 10/05/12 0330 10/05/12 0329  NA 136 139 142 144 143 --  K 3.7 3.8 3.6 4.1 3.6 --  CL 102 105 108 110 108 --  CO2 24 24 23 22 19  --  GLUCOSE 80 84 78 80 76 --  BUN 7 7 9 11 7  --  CREATININE 0.93 0.86 0.94 1.04 0.91 --  CALCIUM 9.0 8.6 8.5 8.8 8.6 --  MG -- -- 1.7 -- -- 1.8  PHOS -- -- -- -- -- 3.9    Liver Function Tests:  Lab 10/06/12 0915 10/04/12 2233  AST 27 16  ALT 14 14  ALKPHOS 76 82  BILITOT 0.5 0.3  PROT 6.4 7.5  ALBUMIN 3.1* 3.8    Lab 10/04/12 2233  LIPASE 75*  AMYLASE --   No results found for this basename: AMMONIA:5 in the last 168 hours  CBC:  Lab 10/07/12  1223 10/07/12 0445 10/06/12 0915 10/05/12 1136 10/05/12 0330 10/04/12 2233  WBC 8.1 9.5 8.1 8.5 8.5 --  NEUTROABS -- -- -- -- -- 2.7  HGB 11.4* 12.4* 12.6* 12.2* 12.2* --  HCT 36.8* 38.6* 39.8 38.1* 38.2* --  MCV 81.2 81.1 82.1 80.9 80.3 --  PLT 209 204 221 221 216 --    Cardiac Enzymes:  Lab 10/05/12 2032 10/05/12 1424 10/05/12 1158 10/04/12 2233  CKTOTAL -- -- -- --  CKMB -- -- -- --  CKMBINDEX -- -- -- --  TROPONINI <0.30 <0.30 <0.30 <0.30   BNP (last 3 results) No results found for this basename: PROBNP:3 in the last 8760 hours   CBG:  Lab 10/09/12 0808 10/08/12 2307  10/08/12 1724 10/08/12 1233 10/08/12 0809  GLUCAP 100* 103* 106* 92 81    Recent Results (from the past 240 hour(s))  MRSA PCR SCREENING     Status: Normal   Collection Time   10/05/12  2:42 AM      Component Value Range Status Comment   MRSA by PCR NEGATIVE  NEGATIVE Final   CULTURE, RESPIRATORY     Status: Normal   Collection Time   10/05/12 11:24 AM      Component Value Range Status Comment   Specimen Description TRACHEAL ASPIRATE   Final    Special Requests Normal   Final    Gram Stain     Final    Value: RARE WBC PRESENT, PREDOMINANTLY PMN     RARE SQUAMOUS EPITHELIAL CELLS PRESENT     RARE GRAM POSITIVE RODS     RARE GRAM NEGATIVE RODS   Culture Non-Pathogenic Oropharyngeal-type Flora Isolated.   Final    Report Status 10/07/2012 FINAL   Final   CULTURE, BLOOD (ROUTINE X 2)     Status: Normal (Preliminary result)   Collection Time   10/05/12 11:30 AM      Component Value Range Status Comment   Specimen Description BLOOD HAND RIGHT   Final    Special Requests BOTTLES DRAWN AEROBIC AND ANAEROBIC 10CC   Final    Culture  Setup Time 10/05/2012 16:36   Final    Culture     Final    Value:        BLOOD CULTURE RECEIVED NO GROWTH TO DATE CULTURE WILL BE HELD FOR 5 DAYS BEFORE ISSUING A FINAL NEGATIVE REPORT   Report Status PENDING   Incomplete   CULTURE, BLOOD (ROUTINE X 2)     Status: Normal (Preliminary result)   Collection Time   10/05/12 11:45 AM      Component Value Range Status Comment   Specimen Description BLOOD HAND RIGHT   Final    Special Requests BOTTLES DRAWN AEROBIC AND ANAEROBIC 10CC   Final    Culture  Setup Time 10/05/2012 16:36   Final    Culture     Final    Value:        BLOOD CULTURE RECEIVED NO GROWTH TO DATE CULTURE WILL BE HELD FOR 5 DAYS BEFORE ISSUING A FINAL NEGATIVE REPORT   Report Status PENDING   Incomplete      Studies: Portable Chest Xray In Am  10/06/2012  *RADIOLOGY REPORT*  Clinical Data: Evaluate lung fields/endotracheal tube position   PORTABLE CHEST - 1 VIEW  Comparison: 09/14/2012  Findings: Endotracheal tube above the thoracic inlet.  Advancement at least 3 cm is suggested.  Enteric tube terminates in the gastric cardia.  Cardiomegaly.  No frank interstitial edema.  Mild patchy bilateral lower lobe opacities, possibly atelectasis.  IMPRESSION: Endotracheal tube above the thoracic inlet.  Advancement at least 3 cm is suggested.  Mild patchy bilateral lower lobe opacities, possibly atelectasis.   Original Report Authenticated By: Charline Bills, M.D.    Dg Chest Port 1 View  10/04/2012  *RADIOLOGY REPORT*  Clinical Data: Endotracheal tube placement; patient unresponsive.  PORTABLE CHEST - 1 VIEW  Comparison: Chest radiograph performed 06/21/2008  Findings: The patient's endotracheal tube is seen ending 4-5 cm above the carina.  Vascular congestion is noted, with scattered bilateral atelectasis. No definite pulmonary edema is seen.  No pleural effusion or pneumothorax is identified.  The cardiomediastinal silhouette is borderline enlarged.  No acute osseous abnormalities are identified.  Postoperative change is noted overlying the cervical spine.  IMPRESSION:  1.  Endotracheal tube seen ending 4-5 cm above the carina. 2.  Vascular congestion and borderline cardiomegaly noted, with scattered bilateral atelectasis.   Original Report Authenticated By: Tonia Ghent, M.D.     Scheduled Meds:   . folic acid  1 mg Oral Daily  . insulin aspart  0-9 Units Subcutaneous TID WC  . multivitamin with minerals  1 tablet Oral Daily  . nicotine  21 mg Transdermal Q1200  . pantoprazole  40 mg Oral Q1200  . thiamine  100 mg Oral Daily   Continuous Infusions:   Active Problems:  Polysubstance overdose  Acute respiratory failure (resolved)  Encephalopathy acute (resolved)    Time spent: 40 minutes   [facility]  [facility] Pager [phone]. If 8PM-8AM, please contact night-coverage at [email or web address], password [facility] 10/09/2012,  10:28 AM  LOS: 5 days

## 2012-10-09 NOTE — Progress Notes (Signed)
Clinical Social Work  CSW staffed case with psych MD who recommends outpatient follow up for medication management, substance abuse treatment and counseling. CSW met with patient at bedside who requests [facility] since it is close to his house. Patient signed ROI form and CSW faxed referral to [facility]. CSW also provided AA and NA information for patient per psych MD request.   Unk Lightning, LCSW [phone]

## 2012-10-10 LAB — GLUCOSE, CAPILLARY
Glucose-Capillary: 99 mg/dL (ref 70–99)
Glucose-Capillary: 120 mg/dL — ABNORMAL HIGH (ref 70–99)

## 2012-10-10 MED ORDER — GABAPENTIN 600 MG PO TABS: 300.0000 mg | ORAL_TABLET | Freq: Two times a day (BID) | ORAL | Status: DC

## 2012-10-10 MED ORDER — THIAMINE HCL 100 MG PO TABS: 100.0000 mg | ORAL_TABLET | Freq: Every day | ORAL | Status: DC

## 2012-10-10 MED ORDER — OLANZAPINE 5 MG PO TABS
5.0000 mg | ORAL_TABLET | Freq: Two times a day (BID) | ORAL | Status: DC
  Administered 2012-10-10: 5 mg via ORAL
  Filled 2012-10-10 (×2): qty 1

## 2012-10-10 MED ORDER — DULOXETINE HCL 30 MG PO CPEP: 30.0000 mg | ORAL_CAPSULE | Freq: Every day | ORAL | Status: DC

## 2012-10-10 MED ORDER — NICOTINE 21 MG/24HR TD PT24: 1.0000 | MEDICATED_PATCH | Freq: Every day | TRANSDERMAL | Status: DC

## 2012-10-10 MED ORDER — GABAPENTIN 300 MG PO CAPS
300.0000 mg | ORAL_CAPSULE | Freq: Two times a day (BID) | ORAL | Status: DC
  Administered 2012-10-10: 300 mg via ORAL
  Filled 2012-10-10 (×2): qty 1

## 2012-10-10 MED ORDER — OLANZAPINE 5 MG PO TABS: 5.0000 mg | ORAL_TABLET | Freq: Two times a day (BID) | ORAL | Status: DC

## 2012-10-10 MED ORDER — DULOXETINE HCL 30 MG PO CPEP
30.0000 mg | ORAL_CAPSULE | Freq: Every day | ORAL | Status: DC
  Administered 2012-10-10: 30 mg via ORAL
  Filled 2012-10-10: qty 1

## 2012-10-10 MED ORDER — GABAPENTIN 600 MG PO TABS
300.0000 mg | ORAL_TABLET | Freq: Two times a day (BID) | ORAL | Status: DC
Start: 2012-10-10 — End: 2012-10-10
  Filled 2012-10-10 (×2): qty 0.5

## 2012-10-10 NOTE — Progress Notes (Signed)
Family of patient called multiple times concerned about pt being released to self care.  Spoke with pt about harm to himself or others, he denies any thoughts of harm at this time.  Pt feels he is safe to go home.  Spoke with MD and CSW, agreed that pt is stable to go home.

## 2012-10-10 NOTE — Progress Notes (Signed)
Clinical Social Work Progress Note PSYCHIATRY SERVICE LINE 10/10/2012  Patient:  Gregory Payne  Account:  [id number]  Admit Date:  10/04/2012  Clinical Social Worker:  Unk Lightning, LCSW  Date/Time:  10/10/2012 10:45 AM  Review of Patient  Overall Medical Condition:   Patient reports he is concerned about psych meds and did not sleep well last night.   Participation Level:  Active  Participation Quality  Appropriate   Other Participation Quality:   Affect  Appropriate    Reaction to Medications/Concerns:   Patient wants to take his psychotropic medications   Modes of Intervention  Support   Summary of Progress/Plan at Discharge   CSW met with patient at bedside. Sitter present but no other visitors. Patient reports he did not sleep well last night but was able to calm down after getting Xanax. Patient reports that he is eager to start taking his psychotropic medications on a regular basis because they minimize his symptoms.    CSW spoke with patient regarding follow up appointment at [facility] on Friday (10/13/12). Patient reports he has transportation and agreeable to follow up appointment. CSW placed all information on dc paperwork.    CSW is signing off but available if needed.

## 2012-10-10 NOTE — Progress Notes (Signed)
Called pts prescriptions in at pts request to [facility]. prescriptions available for pickup

## 2012-10-10 NOTE — Progress Notes (Signed)
Patient restless and agitated. Episodes of sweating. Inquires why he is not on his psych meds. Text page on call MD, order given of 1 mg of xanax one time dose. Pt seems to have settled down. Patient refused to have his CBG done at 2200.
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
## 2012-10-11 LAB — CULTURE, BLOOD (ROUTINE X 2): Culture: NO GROWTH

## 2013-09-05 IMAGING — CT CT ABDOMEN W/ CM
2 of 5 series · 15 of 46 positions shown, 17 images · IV contrast (Omnipaque 300)
Comparison: None

CLINICAL DATA: Umbilical pain and not the, history hypertension,
diabetes, prior abdominal surgery of unspecified type

CT ABDOMEN WITH CONTRAST
TECHNIQUE: Multidetector CT imaging of the abdomen was performed
using the standard protocol following bolus administration of
intravenous contrast.  Sagittal and coronal MPR images
reconstructed from axial data set.
Contrast: 100mL OMNIPAQUE IOHEXOL 300 MG/ML IJ SOLN ; Dilute oral
contrast.

[Series 2: abd_pel_with 5.0 b40f · axial · 0.82mm/px · z∈[-288,-68]mm · 12 of 54 slices shown, 14 images]
[im 5/54  soft-tissue]
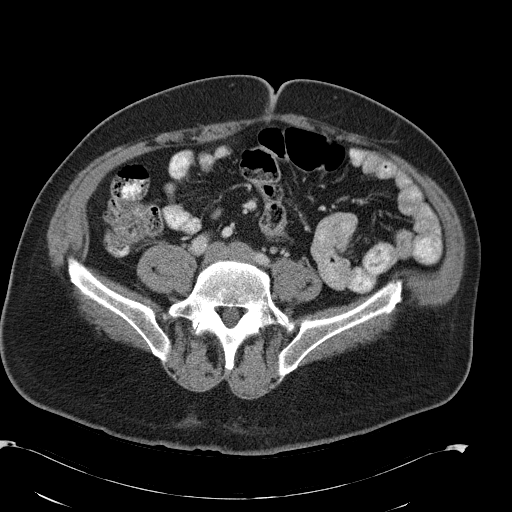
[im 5/54  bone]
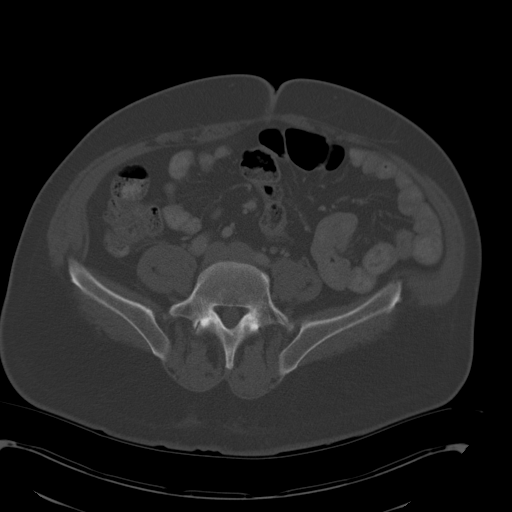
[im 9/54  soft-tissue]
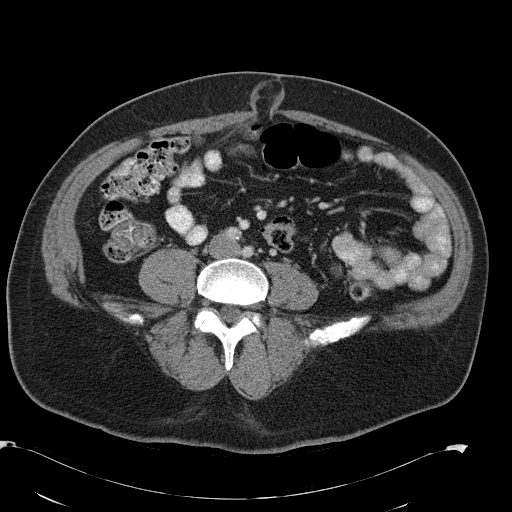
[im 13/54  soft-tissue]
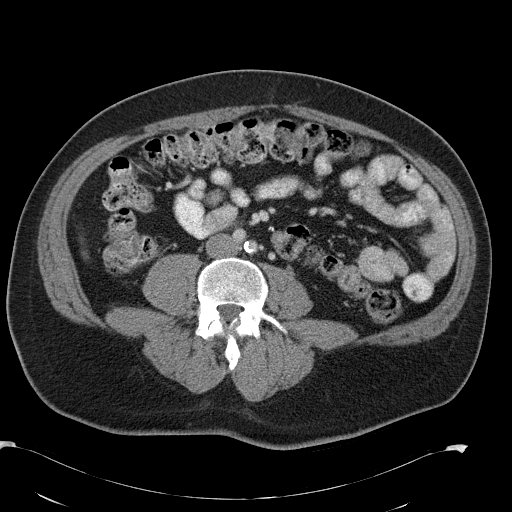
[im 17/54  soft-tissue]
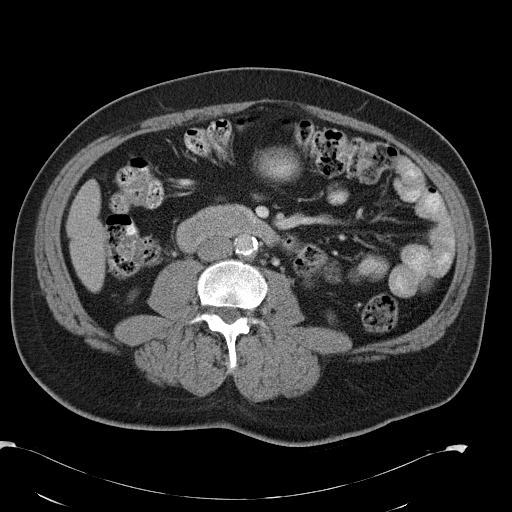
[im 21/54  soft-tissue]
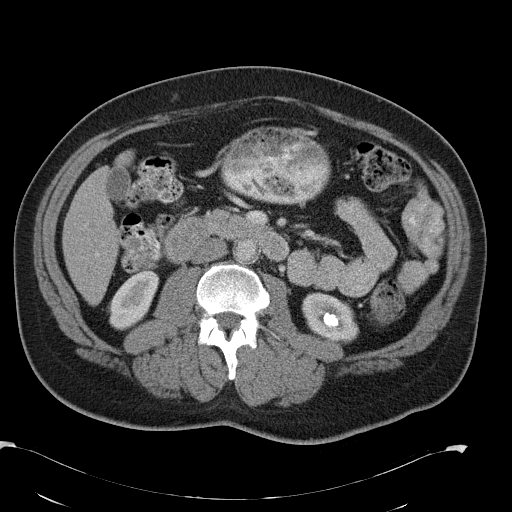
[im 25/54  soft-tissue]
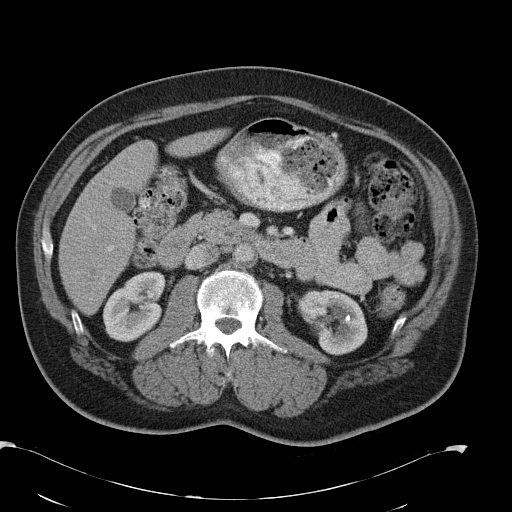
[im 29/54  soft-tissue]
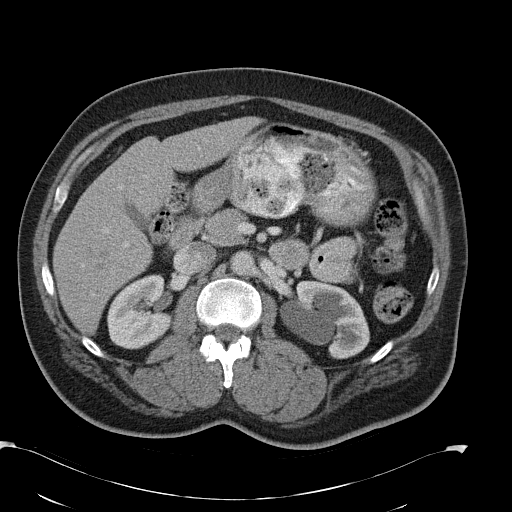
[im 33/54  soft-tissue]
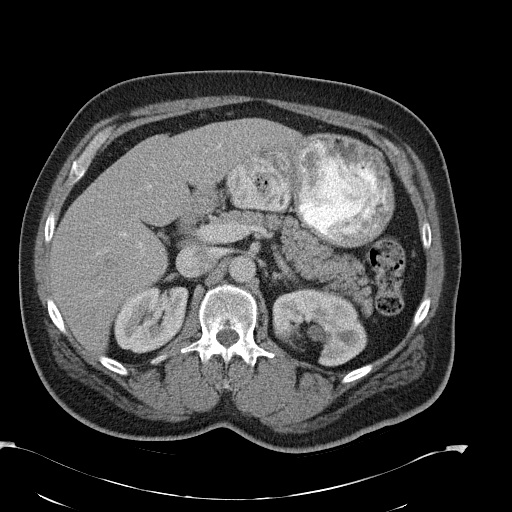
[im 37/54  soft-tissue]
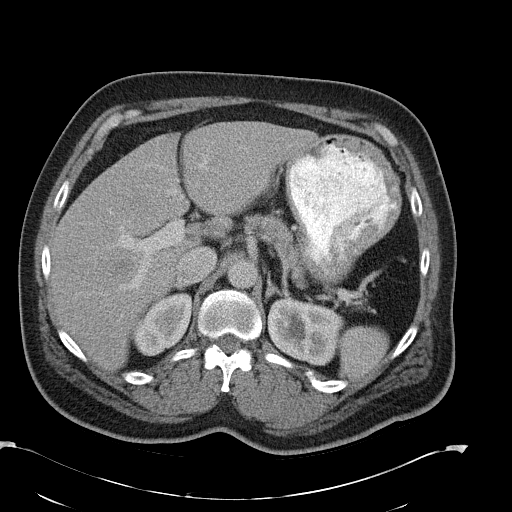
[im 37/54  bone]
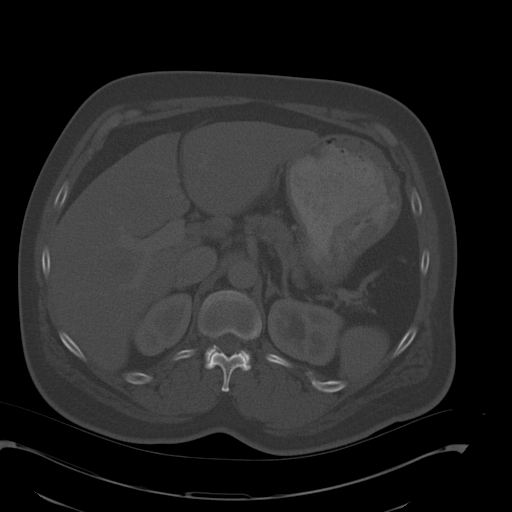
[im 41/54  soft-tissue]
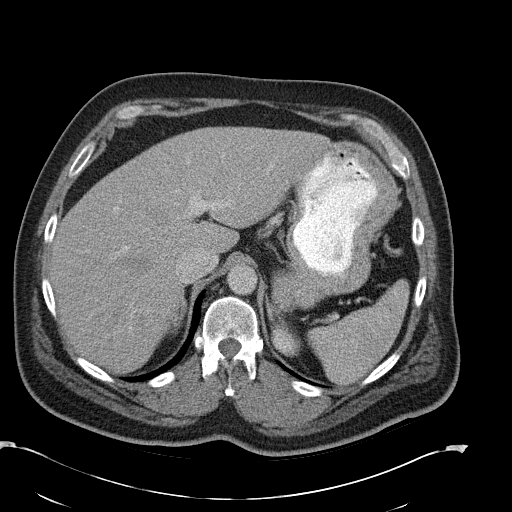
[im 45/54  soft-tissue]
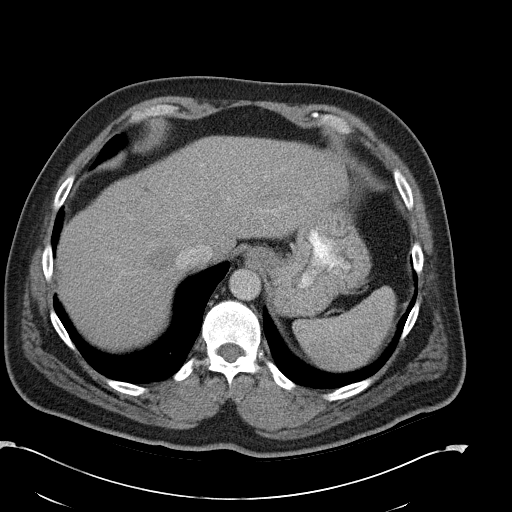
[im 49/54  soft-tissue]
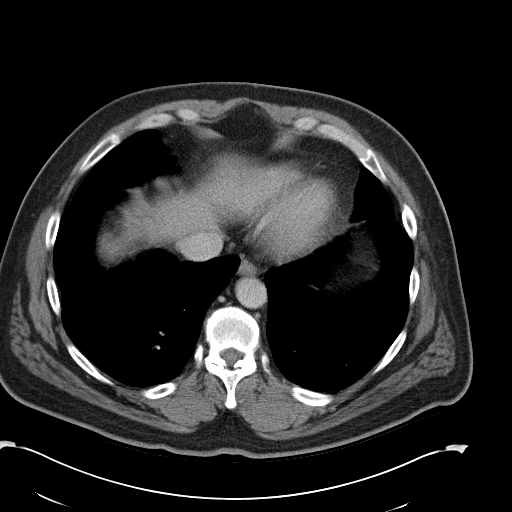

[Series 4: abd_pel_with 3.0 spo cor · coronal · 0.56mm/px · 3 of 106 slices shown]
[im 36/106  soft-tissue]
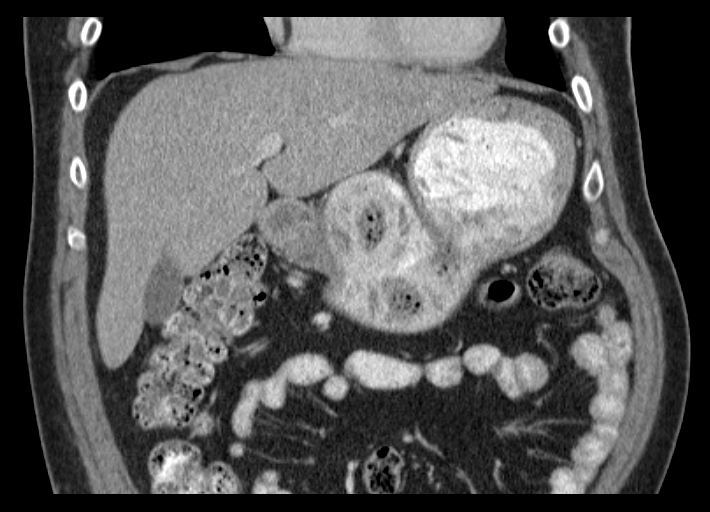
[im 47/106  soft-tissue]
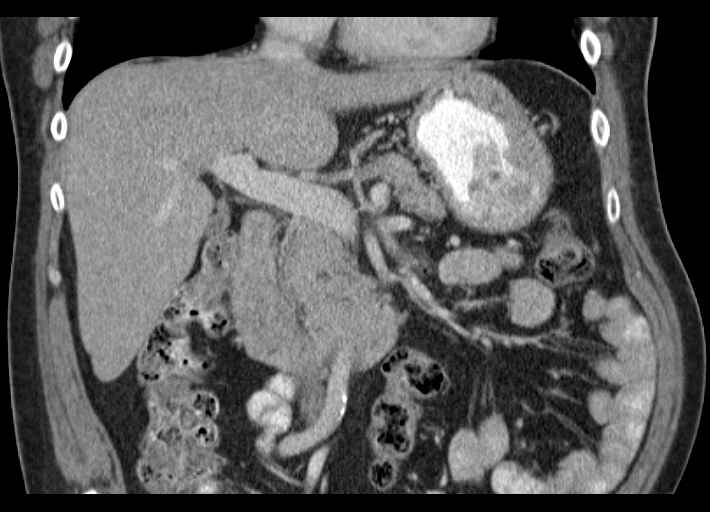
[im 59/106  soft-tissue]
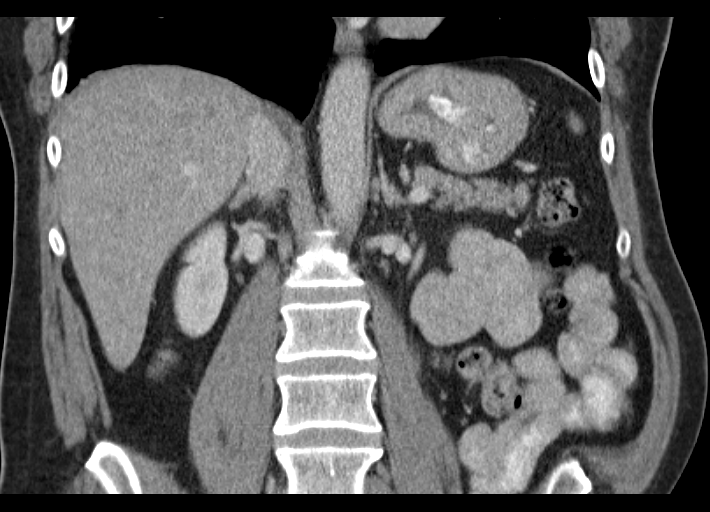

[15 of 46 positions shown; findings below may reference images not displayed]

FINDINGS: Minimal atelectasis lateral right lung base.
Liver, spleen, pancreas, right kidney, and adrenal glands normal
appearance.
Left renal scarring with 2 calculi, largest at lower pole 13 x 13
mm.
Left hydronephrosis with normal caliber ureter consistent with UPJ
obstruction.
No definite renal mass lesion otherwise seen.
Small supraumbilical ventral hernia containing fat.
Minimal wall thickening hernia sac suggesting incarceration.
Fascial defect at hernia measures 11 x 9 mm.
No bowel herniation identified.

Atherosclerotic calcifications aorta without aneurysm.
Large and small bowel loops grossly normal appearance.
Stomach is incompletely distended but question wall thickening at
the proximal to mid stomach where wall measures up to 1.9 cm thick.
No adenopathy, free fluid, or inflammatory process.
No acute osseous findings.
IMPRESSION: Small supraumbilical hernia containing fat, question incarcerated,
with fascial defect measuring 11 x 9 mm.
Scarring left kidney with left hydronephrosis secondary to question
UPJ obstruction.
Nonobstructing left renal calculi.
Question gastric wall thickening; recommend endoscopic evaluation
to exclude neoplasm.

## 2016-01-14 ENCOUNTER — Encounter: Payer: Self-pay | Admitting: Podiatry

## 2016-01-14 ENCOUNTER — Ambulatory Visit (INDEPENDENT_AMBULATORY_CARE_PROVIDER_SITE_OTHER): Payer: Medicare Other | Admitting: Podiatry

## 2016-01-14 VITALS — BP 113/72 | HR 45 | Resp 14

## 2016-01-14 DIAGNOSIS — M79676 Pain in unspecified toe(s): Secondary | ICD-10-CM

## 2016-01-14 DIAGNOSIS — T148 Other injury of unspecified body region: Secondary | ICD-10-CM

## 2016-01-14 DIAGNOSIS — L84 Corns and callosities: Secondary | ICD-10-CM | POA: Diagnosis not present

## 2016-01-14 DIAGNOSIS — M79674 Pain in right toe(s): Secondary | ICD-10-CM

## 2016-01-14 DIAGNOSIS — M898X9 Other specified disorders of bone, unspecified site: Secondary | ICD-10-CM

## 2016-01-14 DIAGNOSIS — B351 Tinea unguium: Secondary | ICD-10-CM | POA: Diagnosis not present

## 2016-01-14 DIAGNOSIS — T148XXA Other injury of unspecified body region, initial encounter: Secondary | ICD-10-CM
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
## 2016-01-15 ENCOUNTER — Encounter: Payer: Self-pay | Admitting: Podiatry

## 2016-01-15 ENCOUNTER — Ambulatory Visit (INDEPENDENT_AMBULATORY_CARE_PROVIDER_SITE_OTHER): Payer: Medicare Other

## 2016-01-15 ENCOUNTER — Ambulatory Visit (INDEPENDENT_AMBULATORY_CARE_PROVIDER_SITE_OTHER): Payer: Medicare Other | Admitting: Podiatry

## 2016-01-15 VITALS — BP 147/85 | HR 61 | Resp 16

## 2016-01-15 DIAGNOSIS — E1149 Type 2 diabetes mellitus with other diabetic neurological complication: Secondary | ICD-10-CM

## 2016-01-15 DIAGNOSIS — M898X9 Other specified disorders of bone, unspecified site: Secondary | ICD-10-CM | POA: Diagnosis not present

## 2016-01-15 DIAGNOSIS — Q828 Other specified congenital malformations of skin: Secondary | ICD-10-CM

## 2016-01-15 DIAGNOSIS — M79672 Pain in left foot: Secondary | ICD-10-CM

## 2016-01-15 DIAGNOSIS — E1140 Type 2 diabetes mellitus with diabetic neuropathy, unspecified: Secondary | ICD-10-CM | POA: Diagnosis not present

## 2016-01-15 NOTE — Progress Notes (Signed)
Subjective:     Patient ID: Gregory Payne, male   DOB: 1961/09/08, 55 y.o.   MRN: [id number]  HPI patient presents difficult to communicate with with thick callus tissue on the left big toe that he has worn a brace for and it seems like it is not fitting him well and a spur on top of the first metatarsocuneiform joint that he states gets very sore and makes it hard for him to wear shoe gear with comfortably   Review of Systems     Objective:   Physical Exam Neurovascular status found to be intact with muscle strength adequate and patient noted to have keratotic lesion on the medial side of the left big toe and a bony lesion on the first metatarsocuneiform that's painful when pressed with moderate depression of the arch noted. Patient does have dysfunction of the anterior tibial tendon    Assessment:     Tendinitis condition keratotic lesion and bony lesion of the first metatarsocuneiform    Plan:     H&P conditions reviewed x-rays reviewed. Today I'm focusing on the big toe and I infiltrated 60 mg like Marcaine mixture and debris did tissue medial side and applied thick padding to try to take pressure off of it. I then went ahead and I discussed correction of the dorsal bone structure which we can do once this other areas feeling better and he is to go get his braces readjusted [employer]. He does have a caseworker with him  X-ray report indicated spur at the metatarsocuneiform joint left with no other pathology except for severe flatfoot deformity

## 2016-01-21 ENCOUNTER — Ambulatory Visit: Payer: Medicare Other | Admitting: Podiatry

## 2016-02-26 ENCOUNTER — Ambulatory Visit (INDEPENDENT_AMBULATORY_CARE_PROVIDER_SITE_OTHER): Payer: Medicare Other | Admitting: Podiatry

## 2016-02-26 ENCOUNTER — Encounter: Payer: Self-pay | Admitting: Podiatry

## 2016-02-26 DIAGNOSIS — L84 Corns and callosities: Secondary | ICD-10-CM

## 2016-02-26 DIAGNOSIS — M898X9 Other specified disorders of bone, unspecified site: Secondary | ICD-10-CM

## 2016-02-26 NOTE — Progress Notes (Signed)
Subjective:     Patient ID: Gregory Payne, male   DOB: [birth year], 55 y.o.   MRN: [id number]  HPI patient presents stating that I still have a bone spur around my first metatarsocuneiform I want to talk about surgery and a lesion on my big toe has come back   Review of Systems     Objective:   Physical Exam Neurovascular status unchanged with osteotic area around the first metatarsal cuneiform joint that's painful when pressed with lesion on the left big toe    Assessment:     Bone spur secondary to structure along with lesion of the left hallux    Plan:     Discussed possible surgery but continue to hold off on this and try to treat conservatively. Utilize padding and wider shoes and reappoint 3 months or earlier if needed

## 2016-05-27 ENCOUNTER — Ambulatory Visit: Payer: Medicare Other | Admitting: Podiatry

## 2016-06-09 ENCOUNTER — Telehealth: Payer: Self-pay | Admitting: Gastroenterology

## 2016-06-09 NOTE — Telephone Encounter (Signed)
Letter mailed

## 2016-06-09 NOTE — Telephone Encounter (Signed)
Pt is due 10 year colonoscopy °

## 2019-10-02 ENCOUNTER — Other Ambulatory Visit: Payer: Self-pay

## 2019-10-02 ENCOUNTER — Other Ambulatory Visit (HOSPITAL_COMMUNITY): Payer: Self-pay | Admitting: Family Medicine

## 2019-10-02 ENCOUNTER — Ambulatory Visit (HOSPITAL_COMMUNITY)
Admission: RE | Admit: 2019-10-02 | Discharge: 2019-10-02 | Disposition: A | Discharge status: Home / Self Care | Payer: Medicare Other | Source: Ambulatory Visit | Attending: Family Medicine | Admitting: Family Medicine

## 2019-10-02 DIAGNOSIS — R52 Pain, unspecified: Secondary | ICD-10-CM | POA: Insufficient documentation

## 2021-09-28 ENCOUNTER — Ambulatory Visit (INDEPENDENT_AMBULATORY_CARE_PROVIDER_SITE_OTHER): Payer: Medicare Other | Admitting: Nurse Practitioner

## 2021-09-28 ENCOUNTER — Other Ambulatory Visit: Payer: Self-pay

## 2021-09-28 ENCOUNTER — Encounter: Payer: Self-pay | Admitting: Nurse Practitioner

## 2021-09-28 VITALS — BP 138/88 | HR 67 | Ht 75.0 in

## 2021-09-28 DIAGNOSIS — M79605 Pain in left leg: Secondary | ICD-10-CM

## 2021-09-28 DIAGNOSIS — E119 Type 2 diabetes mellitus without complications: Secondary | ICD-10-CM

## 2021-09-28 DIAGNOSIS — F200 Paranoid schizophrenia: Secondary | ICD-10-CM

## 2021-09-28 DIAGNOSIS — E1140 Type 2 diabetes mellitus with diabetic neuropathy, unspecified: Secondary | ICD-10-CM | POA: Insufficient documentation

## 2021-09-28 DIAGNOSIS — I10 Essential (primary) hypertension: Secondary | ICD-10-CM

## 2021-09-28 DIAGNOSIS — E785 Hyperlipidemia, unspecified: Secondary | ICD-10-CM

## 2021-09-28 DIAGNOSIS — F17200 Nicotine dependence, unspecified, uncomplicated: Secondary | ICD-10-CM

## 2021-09-28 DIAGNOSIS — F339 Major depressive disorder, recurrent, unspecified: Secondary | ICD-10-CM

## 2021-09-28 DIAGNOSIS — F1910 Other psychoactive substance abuse, uncomplicated: Secondary | ICD-10-CM

## 2021-09-28 DIAGNOSIS — K219 Gastro-esophageal reflux disease without esophagitis: Secondary | ICD-10-CM

## 2021-09-28 MED ORDER — GABAPENTIN 300 MG PO CAPS: 300.0000 mg | ORAL_CAPSULE | Freq: Four times a day (QID) | ORAL | 2 refills | Status: DC

## 2021-09-28 MED ORDER — DULOXETINE HCL 30 MG PO CPEP: 30.0000 mg | ORAL_CAPSULE | Freq: Every day | ORAL | 0 refills | Status: DC

## 2021-09-28 MED ORDER — OLANZAPINE 5 MG PO TABS: 5.0000 mg | ORAL_TABLET | Freq: Two times a day (BID) | ORAL | 0 refills | Status: DC

## 2021-09-28 MED ORDER — AMLODIPINE BESYLATE 5 MG PO TABS: 5.0000 mg | ORAL_TABLET | Freq: Every day | ORAL | 3 refills | Status: DC

## 2021-09-28 NOTE — Assessment & Plan Note (Signed)
Check A1c . Currently not on medication.

## 2021-09-28 NOTE — Assessment & Plan Note (Signed)
Continues to smoke tobacco , not ready to quit. Tobacco cessation education completed, pt educated on the need to quit smoking and the disease that  can arise due to smoking including lung cancer, COPD  he verbalized understanding.

## 2021-09-28 NOTE — Assessment & Plan Note (Signed)
Takes trintellix, 10 mg daily, duloxetine 30 mg daily,  referral made to psych.

## 2021-09-28 NOTE — Assessment & Plan Note (Signed)
Takes omeprazole 20mg daily. 
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
## 2021-09-28 NOTE — Patient Instructions (Signed)
Please get your fasting lab work done tomorrow.    It is important that you exercise regularly at least 30 minutes 5 times a week.  Think about what you will eat, plan ahead. Choose " clean, green, fresh or frozen" over canned, processed or packaged foods which are more sugary, salty and fatty. 70 to 75% of food eaten should be vegetables and fruit. Three meals at set times with snacks allowed between meals, but they must be fruit or vegetables. Aim to eat over a 12 hour period , example 7 am to 7 pm, and STOP after  your last meal of the day. Drink water,generally about 64 ounces per day, no other drink is as healthy. Fruit juice is best enjoyed in a healthy way, by EATING the fruit.  Thanks for choosing [facility], we consider it a privelige to serve you.

## 2021-09-28 NOTE — Assessment & Plan Note (Signed)
DASH diet and commitment to daily physical activity for a minimum of 30 minutes discussed and encouraged, as a part of hypertension management. The importance of attaining a healthy weight is also discussed.  BP/Weight 09/28/2021 01/15/2016 01/14/2016 10/10/2012 10/07/2012 01/28/2012 04/05/2007  Systolic BP 138 147 113 117 - 208 125  Diastolic BP 88 85 72 69 - 94 82  Wt. (Lbs) - - - - 214.29 243 243  BMI 26.78 - - - 26.78 30.37 30.37   Start amlodipine 5mg  daily

## 2021-09-28 NOTE — Assessment & Plan Note (Signed)
Takes zyprexa 5mg .  referral made to psych.

## 2021-09-28 NOTE — Assessment & Plan Note (Signed)
Recheck lipid panel. Restart pravastatin 40 mg daily.

## 2021-09-28 NOTE — Assessment & Plan Note (Signed)
DASH diet and commitment to daily physical activity for a minimum of 30 minutes discussed and encouraged, as a part of hypertension management. The importance of attaining a healthy weight is also discussed.  BP/Weight 09/28/2021 01/15/2016 01/14/2016 10/10/2012 10/07/2012 01/28/2012 05/01/7736  Systolic BP 505 107 125 247 - 998 001  Diastolic BP 88 85 72 69 - 94 82  Wt. (Lbs) - - - - 214.29 243 243  BMI 26.78 - - - 26.78 30.37 30.37  start amlodipine 65m daily.  Check EGFR.

## 2021-09-28 NOTE — Assessment & Plan Note (Signed)
History of drug overdose. Pt stated that he is not taking cocaine currently because it s expensisve. He takes marijuana, smoke cigarettes, and vapes. Pt education completed , he verbalized understanding.

## 2021-09-29 ENCOUNTER — Telehealth: Payer: Self-pay | Admitting: *Deleted

## 2021-09-29 ENCOUNTER — Encounter: Payer: Self-pay | Admitting: Orthopaedic Surgery

## 2021-09-29 MED ORDER — DULOXETINE HCL 30 MG PO CPEP: 30.0000 mg | ORAL_CAPSULE | Freq: Every day | ORAL | 2 refills | Status: DC

## 2021-09-29 MED ORDER — OLANZAPINE 5 MG PO TABS: 5.0000 mg | ORAL_TABLET | Freq: Two times a day (BID) | ORAL | 0 refills | Status: DC

## 2021-09-29 NOTE — Addendum Note (Signed)
Addended by: Donell Beers on: 09/29/2021 12:30 PM   Modules accepted: Orders

## 2021-09-29 NOTE — Chronic Care Management (AMB) (Signed)
°  Chronic Care Management   Outreach Note  09/29/2021 Name: Gregory Payne MRN: [id number] DOB: [birth year]  Gregory Payne is a 60 y.o. year old male who is a primary care patient of Donell Beers, FNP. I reached out to Gregory Payne by phone today in response to a referral sent by Gregory Payne's primary care provider.  An unsuccessful telephone outreach was attempted today. The patient was referred to the case management team for assistance with care management and care coordination.   Follow Up Plan: A HIPAA compliant phone message was left for the patient providing contact information and requesting a return call.  If patient returns call to provider office, please advise to call Embedded Care Management Care Guide Anavi Branscum at [phone]  Burman Nieves, CCMA Care Guide, Embedded Care Coordination [facility]   Care Management  Direct Dial: [phone]

## 2021-09-29 NOTE — Addendum Note (Signed)
Addended by: Donell Beers on: 09/29/2021 09:16 AM   Modules accepted: Orders

## 2021-10-01 NOTE — Chronic Care Management (AMB) (Signed)
Chronic Care Management   Note  10/01/2021 Name: Gregory Payne MRN: [id number] DOB: 1960/11/13  Gregory Payne is a 60 y.o. year old male who is a primary care patient of Renee Rival, FNP. I reached out to [name] by phone today in response to a referral sent by Gregory Payne PCP.  [name] was given information about Chronic Care Management services today including:  CCM service includes personalized support from designated clinical staff supervised by his physician, including individualized plan of care and coordination with other care providers 24/7 contact phone numbers for assistance for urgent and routine care needs. Service will only be billed when office clinical staff spend 20 minutes or more in a month to coordinate care. Only one practitioner may furnish and bill the service in a calendar month. The patient may stop CCM services at any time (effective at the end of the month) by phone call to the office staff. The patient is responsible for co-pay (up to 20% after annual deductible is met) if co-pay is required by the individual health plan.   Patient agreed to services and verbal consent obtained.   Follow up plan: Telephone appointment with care management team member scheduled for: 10/20/2021  Julian Hy, [place] Management  Direct Dial: [phone]

## 2021-10-07 ENCOUNTER — Other Ambulatory Visit: Payer: Self-pay

## 2021-10-07 ENCOUNTER — Ambulatory Visit: Payer: Medicare Other

## 2021-10-20 ENCOUNTER — Ambulatory Visit (INDEPENDENT_AMBULATORY_CARE_PROVIDER_SITE_OTHER): Payer: Medicare Other | Admitting: Pharmacist

## 2021-10-20 DIAGNOSIS — F339 Major depressive disorder, recurrent, unspecified: Secondary | ICD-10-CM

## 2021-10-20 DIAGNOSIS — E785 Hyperlipidemia, unspecified: Secondary | ICD-10-CM

## 2021-10-20 DIAGNOSIS — I10 Essential (primary) hypertension: Secondary | ICD-10-CM

## 2021-10-20 DIAGNOSIS — F200 Paranoid schizophrenia: Secondary | ICD-10-CM

## 2021-10-20 DIAGNOSIS — E119 Type 2 diabetes mellitus without complications: Secondary | ICD-10-CM

## 2021-10-20 NOTE — Chronic Care Management (AMB) (Signed)
Chronic Care Management Pharmacy Note  10/20/2021 Name:  Gregory Payne MRN:  [id number] DOB:  18-Mar-1961  Summary: Type 2 Diabetes Unclear; Most recent A1c unknown Current medications:  none, metformin recently discontinued Check A1c, urine for albumin/creatinine ration (microalbumin), and BMP  Hypertension Blood pressure control is unclear since blood pressure has only been checked in clinic once. Blood pressure goal is <130/80 mmHg per 2017 AHA/ACC guidelines. Current medications: amlodipine 5 mg by mouth once daily Taking medications as directed: no, patient reports only taking intermittently when he thinks his blood pressure is high Discussed need for medication compliance  Hyperlipidemia Unclear. Lipid panel yet to be collected. Current medications: pravastatin 40 mg by mouth once daily Taking medications as directed:  patient reports poor adherence since he does not like taking medications if he does not feel like he has a problem Discussed need for medication compliance Patient instructed to come get lipid panel drawn  Depression and schizophrenia Uncontrolled per patient. Likely has substance use disorder as well per prior notes. Pysch referral previously denied due to "no adult psych providers" Current medications:  duloxetine 30 mg by mouth daily, Trintellix 10 mg by mouth daily, olanzapine 5 mg by mouth twice daily, and gabapentin 300 mg by mouth four times daily Current non-pharmacologic treatment: None Continue current medications as above Will refer to chronic care management LCSW  Subjective: Gregory Payne is an 61 y.o. year old male who is a primary patient of [place], Dewaine Conger, FNP.  The CCM team was consulted for assistance with disease management and care coordination needs.    Engaged with patient by telephone for initial visit in response to provider referral for pharmacy case management and/or care coordination services.   Consent to Services:   The patient was given the following information about Chronic Care Management services today, agreed to services, and gave verbal consent: 1. CCM service includes personalized support from designated clinical staff supervised by the primary care provider, including individualized plan of care and coordination with other care providers 2. 24/7 contact phone numbers for assistance for urgent and routine care needs. 3. Service will only be billed when office clinical staff spend 20 minutes or more in a month to coordinate care. 4. Only one practitioner may furnish and bill the service in a calendar month. 5.The patient may stop CCM services at any time (effective at the end of the month) by phone call to the office staff. 6. The patient will be responsible for cost sharing (co-pay) of up to 20% of the service fee (after annual deductible is met). Patient agreed to services and consent obtained.  Patient Care Team: Renee Rival, FNP as PCP - General (Nurse Practitioner) Beryle Lathe, [facility] (Pharmacist)  Objective:  Lab Results  Component Value Date   CREATININE 0.93 10/07/2012   CREATININE 0.86 10/07/2012   CREATININE 0.94 10/06/2012    No results found for: HGBA1C Last diabetic Eye exam: No results found for: HMDIABEYEEXA  Last diabetic Foot exam: No results found for: HMDIABFOOTEX      Component Value Date/Time   CHOL 239 (H) 10/10/2006 2058   TRIG 442 (H) 10/10/2006 2058   HDL 33 (L) 10/10/2006 2058   CHOLHDL 7.2 Ratio 10/10/2006 2058   VLDL NOT CALC mg/dL 10/10/2006 2058   [place] See Comment mg/dL 10/10/2006 2058    Hepatic Function Latest Ref Rng & Units 10/06/2012 10/04/2012 10/10/2006  Total Protein 6.0 - 8.3 g/dL 6.4 7.5 7.0  Albumin 3.5 -  5.2 g/dL 3.1(L) 3.8 4.0  AST 0 - 37 U/L _0 ALT 0 - 53 U/L 14 14 48  Alk Phosphatase 39 - 117 U/L 76 82 99  Total Bilirubin 0.3 - 1.2 mg/dL 0.5 0.3 0.3    No results found for: TSH, FREET4  CBC Latest Ref Rng &  Units 10/07/2012 10/07/2012 10/06/2012  WBC 4.0 - 10.5 K/uL 8.1 9.5 8.1  Hemoglobin 13.0 - 17.0 g/dL 11.4(L) 12.4(L) 12.6(L)  Hematocrit 39.0 - 52.0 % 36.8(L) 38.6(L) 39.8  Platelets 150 - 400 K/uL 209 204 221    No results found for: VD25OH  Clinical ASCVD:  unknown The ASCVD Risk score (Arnett DK, et al., 2019) failed to calculate for the following reasons:   Cannot find a previous HDL lab   Cannot find a previous total cholesterol lab    Social History   Tobacco Use  Smoking Status Every Day   Packs/day: 0.50   Years: 35.00   Pack years: 17.50   Types: Cigarettes  Smokeless Tobacco Not on file  Tobacco Comments   He has been smoking since 61 years old. He is not ready to quit smoking.   BP Readings from Last 3 Encounters:  09/28/21 138/88  01/15/16 (!) 147/85  01/14/16 113/72   Pulse Readings from Last 3 Encounters:  09/28/21 67  01/15/16 61  01/14/16 (!) 45   Wt Readings from Last 3 Encounters:  10/07/12 214 lb 4.6 oz (97.2 kg)  01/28/12 243 lb (110.2 kg)  08/05/06 (!) 243 lb (110.2 kg)    Assessment: Review of patient past medical history, allergies, medications, health status, including review of consultants reports, laboratory and other test data, was performed as part of comprehensive evaluation and provision of chronic care management services.   SDOH:  (Social Determinants of Health) assessments and interventions performed:  SDOH Interventions    Flowsheet Row Most Recent Value  SDOH Interventions   SDOH Interventions for the Following Domains Depression  Depression Interventions/Treatment  Referral to Psychiatry       CCM Care Plan  Allergies  Allergen Reactions   Naproxen     REACTION: GI bleed from internal Hemmorhoids    Medications Reviewed Today     Reviewed by Beryle Lathe, [facility] (Pharmacist) on 10/20/21 at 1322  Med List Status: <None>   Medication Order Taking? Sig Documenting Provider Last Dose Status Informant   amLODipine (NORVASC) 5 MG tablet [id number] Yes Take 1 tablet (5 mg total) by mouth daily. Renee Rival, FNP Taking Active            Med Note Waldo Laine, Gwenyth Allegra   Tue Oct 20, 2021  1:19 PM) Patient reports he is only checking as needed if he thinks his blood pressure is high  cyanocobalamin 1000 MCG tablet [id number] Yes Take 1,000 mcg by mouth daily. [provider] Taking Active   docusate sodium (COLACE) 100 MG capsule [id number] No Take 400 mg by mouth daily. Reported on 01/14/2016  Patient not taking: Reported on 09/28/2021   [provider] Not Taking Active Multiple Informants  DULoxetine (CYMBALTA) 30 MG capsule [id number] Yes Take 1 capsule (30 mg total) by mouth daily. Renee Rival, FNP Taking Active   gabapentin (NEURONTIN) 300 MG capsule [id number] Yes Take 1 capsule (300 mg total) by mouth 4 (four) times daily. Renee Rival, FNP Taking Active   meloxicam (MOBIC) 15 MG tablet [id number] Yes Take 15 mg by mouth daily. [provider] Taking  Active   OLANZapine (ZYPREXA) 5 MG tablet [id number] Yes Take 1 tablet (5 mg total) by mouth 2 (two) times daily. Renee Rival, FNP Taking Active   omeprazole (PRILOSEC) 20 MG capsule [id number] Yes Take 20 mg by mouth daily as needed. [provider] Taking Active Self  pravastatin (PRAVACHOL) 40 MG tablet [id number] No Take 40 mg by mouth daily. Reported on 01/14/2016  Patient not taking: Reported on 09/28/2021   [provider] Not Taking Active Multiple Informants           Med Note Fransisco Hertz Oct 04, 2012 11:01 PM) Filled 08/30/12 for30 tabs  empty  VITAMIN D, CHOLECALCIFEROL, PO [id number] Yes Take by mouth. [provider] Taking Active             Patient Active Problem List   Diagnosis Date Noted   Left leg pain 09/28/2021   Diabetes mellitus without complication ([place]) 88/91/6945   Hypertension 09/28/2021   Hyperlipidemia 09/28/2021   Depression,  recurrent ([place]) 09/28/2021   Polysubstance abuse ([place]) 09/28/2021   Polysubstance overdose 10/05/2012   Acute respiratory failure (resolved) 10/05/2012   Encephalopathy acute (resolved) 10/05/2012   OBESITY NOS 08/16/2006   SCHIZOPHRENIA, PARANOID, CHRONIC 08/16/2006   ANXIETY 08/16/2006   ERECTILE DYSFUNCTION 08/16/2006   DISORDER, TOBACCO USE 08/16/2006   DEPRESSION 08/16/2006   Essential hypertension 08/16/2006   BRONCHITIS NOS 08/16/2006   GERD 08/16/2006   RENAL CALCULUS 08/16/2006   ANKLE PAIN 08/16/2006   HYPERGLYCEMIA 08/16/2006    There is no immunization history for the selected administration types on file for this patient.  Conditions to be addressed/monitored: HTN, HLD, DMII, and Depression  Care Plan : Medication Management  Updates made by Beryle Lathe, [place] since 10/20/2021 12:00 AM     Problem: T2DM, HTN, HLD, Depression   Priority: High  Onset Date: 10/20/2021     Long-Range Goal: Disease Progression Prevention   Start Date: 10/20/2021  Expected End Date: 01/18/2022  This Visit's Progress: On track  Priority: High  Note:   Current Barriers:  Unable to independently monitor therapeutic efficacy  Pharmacist Clinical Goal(s):  Through collaboration with PharmD and provider, patient will  Achieve adherence to monitoring guidelines and medication adherence to achieve therapeutic efficacy   Interventions: 1:1 collaboration with Renee Rival, FNP regarding development and update of comprehensive plan of care as evidenced by provider attestation and co-signature Inter-disciplinary care team collaboration (see longitudinal plan of care) Comprehensive medication review performed; medication list updated in electronic medical record  Type 2 Diabetes - New goal.: Unclear ; Most recent A1c  unknown Current medications:  none, metformin recently discontinued Intolerances: none Taking medications as directed: n/a Side effects thought to be  attributed to current medication regimen: n/a Hypoglycemia prevention: not indicated at this time Current meal patterns: not discussed today Current exercise: not discussed today On a statin: yes On aspirin 81 mg daily: no Last microalbumin/creatinine ratio: unknown; on an ACEi/ARB: no Last eye exam: unknown Last foot exam: unknown Current glucose readings:  unknown Check A1c, urine for albumin/creatinine ration (microalbumin), and BMP  Hypertension - New goal.: Blood pressure control is unclear since blood pressure has only been checked in clinic once. Blood pressure goal is <130/80 mmHg per 2017 AHA/ACC guidelines. Current medications: amlodipine 5 mg by mouth once daily Intolerances: none Taking medications as directed: no, patient reports only taking intermittently when he thinks his blood pressure is high Side effects thought to be attributed  to current medication regimen: no Current home blood pressure: patient does not check Continue current medications as above Encourage dietary sodium restriction/DASH diet Recommend home blood pressure monitoring to discuss at next visit Discussed need for medication compliance Reviewed risks of hypertension, principles of treatment and consequences of untreated hypertension  Hyperlipidemia - New goal.: Unclear .  Lipid panel yet to be collected. Current medications: pravastatin 40 mg by mouth once daily Intolerances: none Taking medications as directed:  patient reports poor adherence since he does not like taking medications if he does not feel like he has a problem Side effects thought to be attributed to current medication regimen: no Continue current medications as above Encourage dietary reduction of high fat containing foods such as butter, nuts, bacon, egg yolks, etc. Reviewed risks of hyperlipidemia, principles of treatment and consequences of untreated hyperlipidemia Discussed need for medication compliance Patient instructed to  come get lipid panel drawn  Depression and schizophrenia  - New goal.: Uncontrolled per patient. Likely has substance use disorder as well per prior notes. Pysch referral previously denied due to "no adult psych providers" Current medications:  duloxetine 30 mg by mouth daily, Trintellix 10 mg by mouth daily, olanzapine 5 mg by mouth twice daily, and gabapentin 300 mg by mouth four times daily Current non-pharmacologic treatment: None Continue current medications as above Will refer to chronic care management LCSW  Patient Goals/Self-Care Activities Patient will:  Take medications as prescribed Collaborate with provider on medication access solutions Come get lab work done  Follow Up Plan: Telephone follow up appointment with care management team member scheduled for: 11/25/21      Medication Assistance: None required.  Patient affirms current coverage meets needs.  Patient's preferred pharmacy is:  [facility], [address] [place] [address] Phone: [phone] Fax: [phone]  Follow Up:  Patient agrees to Care Plan and Follow-up.  Plan: Telephone follow up appointment with care management team member scheduled for:  11/25/21  Kennon Holter, PharmD, [place], [place] Clinical Pharmacist Practitioner [facility] Primary Care [phone]

## 2021-10-20 NOTE — Patient Instructions (Signed)
Gregory Payne,  It was great to talk to you today!  Please call me with any questions or concerns.   Visit Information   Following is a copy of your full care plan:  Care Plan : Medication Management  Updates made by Beryle Lathe, [place] since 10/20/2021 12:00 AM     Problem: T2DM, HTN, HLD, Depression   Priority: High  Onset Date: 10/20/2021     Long-Range Goal: Disease Progression Prevention   Start Date: 10/20/2021  Expected End Date: 01/18/2022  This Visit's Progress: On track  Priority: High  Note:   Current Barriers:  Unable to independently monitor therapeutic efficacy  Pharmacist Clinical Goal(s):  Through collaboration with PharmD and provider, patient will  Achieve adherence to monitoring guidelines and medication adherence to achieve therapeutic efficacy   Interventions: 1:1 collaboration with Renee Rival, FNP regarding development and update of comprehensive plan of care as evidenced by provider attestation and co-signature Inter-disciplinary care team collaboration (see longitudinal plan of care) Comprehensive medication review performed; medication list updated in electronic medical record  Type 2 Diabetes - New goal.: Unclear ; Most recent A1c  unknown Current medications:  none, metformin recently discontinued Intolerances: none Taking medications as directed: n/a Side effects thought to be attributed to current medication regimen: n/a Hypoglycemia prevention: not indicated at this time Current meal patterns: not discussed today Current exercise: not discussed today On a statin: yes On aspirin 81 mg daily: no Last microalbumin/creatinine ratio: unknown; on an ACEi/ARB: no Last eye exam: unknown Last foot exam: unknown Current glucose readings:  unknown Check A1c, urine for albumin/creatinine ration (microalbumin), and BMP  Hypertension - New goal.: Blood pressure control is unclear since blood pressure has only been checked in  clinic once. Blood pressure goal is <130/80 mmHg per 2017 AHA/ACC guidelines. Current medications: amlodipine 5 mg by mouth once daily Intolerances: none Taking medications as directed: no, patient reports only taking intermittently when he thinks his blood pressure is high Side effects thought to be attributed to current medication regimen: no Current home blood pressure: patient does not check Continue current medications as above Encourage dietary sodium restriction/DASH diet Recommend home blood pressure monitoring to discuss at next visit Discussed need for medication compliance Reviewed risks of hypertension, principles of treatment and consequences of untreated hypertension  Hyperlipidemia - New goal.: Unclear .  Lipid panel yet to be collected. Current medications: pravastatin 40 mg by mouth once daily Intolerances: none Taking medications as directed:  patient reports poor adherence since he does not like taking medications if he does not feel like he has a problem Side effects thought to be attributed to current medication regimen: no Continue current medications as above Encourage dietary reduction of high fat containing foods such as butter, nuts, bacon, egg yolks, etc. Reviewed risks of hyperlipidemia, principles of treatment and consequences of untreated hyperlipidemia Discussed need for medication compliance Patient instructed to come get lipid panel drawn  Depression and schizophrenia  - New goal.: Uncontrolled per patient. Likely has substance use disorder as well per prior notes. Pysch referral previously denied due to "no adult psych providers" Current medications:  duloxetine 30 mg by mouth daily, Trintellix 10 mg by mouth daily, olanzapine 5 mg by mouth twice daily, and gabapentin 300 mg by mouth four times daily Current non-pharmacologic treatment: None Continue current medications as above Will refer to chronic care management LCSW  Patient Goals/Self-Care  Activities Patient will:  Take medications as prescribed Collaborate with provider on medication  access solutions Come get lab work done  Follow Up Plan: Telephone follow up appointment with care management team member scheduled for: 11/25/21      Consent to CCM Services: Gregory Payne was given information about Chronic Care Management services including:  CCM service includes personalized support from designated clinical staff supervised by his physician, including individualized plan of care and coordination with other care providers 24/7 contact phone numbers for assistance for urgent and routine care needs. Service will only be billed when office clinical staff spend 20 minutes or more in a month to coordinate care. Only one practitioner may furnish and bill the service in a calendar month. The patient may stop CCM services at any time (effective at the end of the month) by phone call to the office staff. The patient will be responsible for cost sharing (co-pay) of up to 20% of the service fee (after annual deductible is met).  Patient agreed to services and verbal consent obtained.   Plan: Telephone follow up appointment with care management team member scheduled for:  11/25/21  Kennon Holter, PharmD, BCACP, CPP Clinical Pharmacist Practitioner [facility] [phone]  Please call the care guide team at [phone] if you need to cancel or reschedule your appointment.   The patient verbalized understanding of instructions, educational materials, and care plan provided today and declined offer to receive copy of patient instructions, educational materials, and care plan.

## 2021-10-21 ENCOUNTER — Telehealth: Payer: Self-pay | Admitting: *Deleted

## 2021-10-21 ENCOUNTER — Ambulatory Visit: Payer: Medicare Other | Admitting: *Deleted

## 2021-10-21 DIAGNOSIS — F339 Major depressive disorder, recurrent, unspecified: Secondary | ICD-10-CM

## 2021-10-21 DIAGNOSIS — F200 Paranoid schizophrenia: Secondary | ICD-10-CM

## 2021-10-21 DIAGNOSIS — I10 Essential (primary) hypertension: Secondary | ICD-10-CM

## 2021-10-21 DIAGNOSIS — E119 Type 2 diabetes mellitus without complications: Secondary | ICD-10-CM

## 2021-10-21 NOTE — Telephone Encounter (Signed)
°  Care Management   Follow Up Note   10/21/2021 Name: Gregory Payne MRN: [id number] DOB: 09/29/1961   Referred by: Donell Beers, FNP Reason for referral : No chief complaint on file.   An unsuccessful telephone outreach was attempted today. The patient was referred to the case management team for assistance with care management and care coordination.   Follow Up Plan: The care management team will reach out to the patient again over the next 10 days.   Reece Levy MSW, LCSW Licensed Clinical Social Veterinary surgeon Care

## 2021-10-22 NOTE — Patient Instructions (Signed)
Visit Information  Thank you for taking time to visit with me today. Please don't hesitate to contact me if I can be of assistance to you before our next scheduled telephone appointment.  Following are the goals we discussed today:   Continue with therapy Continue with compliance of taking medication  I have placed a referral [facility] they will contact you.    Our next appointment is by telephone on 11/05/21 at 3pm  Please call the care guide team at [phone] if you need to cancel or reschedule your appointment.   If you are experiencing a Mental Health or Behavioral Health Crisis or need someone to talk to, please call the Suicide and Crisis Lifeline: 988 call the [place] National Suicide Prevention Lifeline: 929-324-8236 or TTY: 309 596 7267 TTY 510 227 7987) to talk to a trained counselor call 1-800-273-TALK (toll free, 24 hour hotline) go to [facility] [address] [place] [phone]) call the [facility] Line: [phone] call 911   The patient verbalized understanding of instructions, educational materials, and care plan provided today and declined offer to receive copy of patient instructions, educational materials, and care plan.  Reece Levy MSW, LCSW Licensed Clinical Product manager Primary Care [phone]

## 2021-10-22 NOTE — Chronic Care Management (AMB) (Signed)
Chronic Care Management    Clinical Social Work Note  10/22/2021 Name: Gregory Payne MRN: [id number] DOB: [birth year]  Gregory Payne is a 61 y.o. year old male who is a primary care patient of Donell Beers, FNP. The CCM team was consulted to assist the patient with chronic disease management and/or care coordination needs related to: Mental Health Counseling and Resources.   Engaged with patient by telephone for initial visit in response to provider referral for social work chronic care management and care coordination services.   Consent to Services:  The patient was given information about Chronic Care Management services, agreed to services, and gave verbal consent prior to initiation of services.  Please see initial visit note for detailed documentation.   Patient agreed to services and consent obtained.   Assessment: Review of patient past medical history, allergies, medications, and health status, including review of relevant consultants reports was performed today as part of a comprehensive evaluation and provision of chronic care management and care coordination services.     SDOH (Social Determinants of Health) assessments and interventions performed:  SDOH Interventions    Flowsheet Row Most Recent Value  SDOH Interventions   Depression Interventions/Treatment  Referral to Psychiatry, Medication  [PT REQUESTING NEW PSYCHIATRIST]        Advanced Directives Status: Not addressed in this encounter.  CCM Care Plan  Allergies  Allergen Reactions   Naproxen     REACTION: GI bleed from internal Hemmorhoids    Outpatient Encounter Medications as of 10/21/2021  Medication Sig Note   amLODipine (NORVASC) 5 MG tablet Take 1 tablet (5 mg total) by mouth daily. 10/20/2021: Patient reports he is only checking as needed if he thinks his blood pressure is high   cyanocobalamin 1000 MCG tablet Take 1,000 mcg by mouth daily.    docusate sodium (COLACE) 100 MG capsule  Take 400 mg by mouth daily. Reported on 01/14/2016 (Patient not taking: Reported on 09/28/2021)    DULoxetine (CYMBALTA) 30 MG capsule Take 1 capsule (30 mg total) by mouth daily.    gabapentin (NEURONTIN) 300 MG capsule Take 1 capsule (300 mg total) by mouth 4 (four) times daily.    meloxicam (MOBIC) 15 MG tablet Take 15 mg by mouth daily.    OLANZapine (ZYPREXA) 5 MG tablet Take 1 tablet (5 mg total) by mouth 2 (two) times daily.    omeprazole (PRILOSEC) 20 MG capsule Take 20 mg by mouth daily as needed.    pravastatin (PRAVACHOL) 40 MG tablet Take 40 mg by mouth daily. Reported on 01/14/2016 (Patient not taking: Reported on 09/28/2021) 10/04/2012: Filled 08/30/12 for30 tabs  empty   VITAMIN D, CHOLECALCIFEROL, PO Take by mouth.    No facility-administered encounter medications on file as of 10/21/2021.    Patient Active Problem List   Diagnosis Date Noted   Left leg pain 09/28/2021   Diabetes mellitus without complication (HCC) 09/28/2021   Hypertension 09/28/2021   Hyperlipidemia 09/28/2021   Depression, recurrent (HCC) 09/28/2021   Polysubstance abuse (HCC) 09/28/2021   Polysubstance overdose 10/05/2012   Acute respiratory failure (resolved) 10/05/2012   Encephalopathy acute (resolved) 10/05/2012   OBESITY NOS 08/16/2006   SCHIZOPHRENIA, PARANOID, CHRONIC 08/16/2006   ANXIETY 08/16/2006   ERECTILE DYSFUNCTION 08/16/2006   DISORDER, TOBACCO USE 08/16/2006   DEPRESSION 08/16/2006   Essential hypertension 08/16/2006   BRONCHITIS NOS 08/16/2006   GERD 08/16/2006   RENAL CALCULUS 08/16/2006   ANKLE PAIN 08/16/2006   HYPERGLYCEMIA 08/16/2006  Conditions to be addressed/monitored: Anxiety and Depression; Mental Health Concerns   Care Plan : LCSW Plan of Care  Updates made by Buck Mam, LCSW since 10/22/2021 12:00 AM     Problem: Emotional Distress      Long-Range Goal: Emotional Health Supported through   Start Date: 10/22/2021  Expected End Date: 12/23/2021  This  Visit's Progress: On track  Priority: High  Note:   Current barriers:   Chronic Mental Health needs related to schizophrenia , depression and anxiety Limited social support, Transportation, and Mental Health Concerns  Needs Support, Education, and Care Coordination in order to meet unmet mental health needs. Clinical Goal(s): demonstrate a reduction in symptoms related to :mental health needs   Clinical Interventions:  CSW spoke with pt by phone. Pt was alert, oriented and appropriate. Pt with good insight to his mental illness and appears to be managing well; however, does request to be linked with a new Psychiatrist. Per pt, he was seeing a Psychiatrist in [place] but stopped seeing him due to "got me upset when he cut me off of my Gabapentin".  Pt is currently taking Zyprexa, Cymbaltafor his mental health needs. Pt reports having shcizopehrenia for a long time- last hospitalized for SI "many years ago". Pt denies any recent or current SI/HI and speaks quite openly about his illness. "I think I am past that stage of being suicidal.....just need medication adjustments".  Pt would like to be linked with a counselor as well. Referral to be placed to [facility] for further linking/appointment setting for both disciplines.   Depression screen PHQ 2/9 10/22/2021  Decreased Interest 0  Down, Depressed, Hopeless 0  PHQ - 2 Score 0    Assessed patient's previous and current treatment, coping skills, support system and barriers to care  Depression screen reviewed  PHQ2/ PHQ9 completed Solution-Focused Strategies employed:   Active listening / Reflection utilized  Emotional Support Provided Verbalization of feelings encouraged  Suicidal Ideation/Homicidal Ideation assessed: Discussed referral to [facility] to assist with connecting to mental health provider Discussed referral for psychiatry  ; Review resources, discussed options and provided patient information about  Transportation provided by  insurance provider SCAT transportation 1:1 collaboration with primary care provider regarding development and update of comprehensive plan of care as evidenced by provider attestation and co-signature Inter-disciplinary care team collaboration (see longitudinal plan of care) Patient Goals/Self-Care Activities: Over the next 30 days Continue with therapy Continue with compliance of taking medication  I have placed a referral [facility] they will contact you.           Follow Up Plan: Appointment scheduled for SW follow up with client by phone on: 11/05/21      Reece Levy MSW, LCSW Licensed Clinical Social Systems analyst Primary Care [phone]

## 2021-11-04 ENCOUNTER — Encounter: Payer: Self-pay | Admitting: Nurse Practitioner

## 2021-11-04 ENCOUNTER — Other Ambulatory Visit: Payer: Self-pay | Admitting: Nurse Practitioner

## 2021-11-04 DIAGNOSIS — E785 Hyperlipidemia, unspecified: Secondary | ICD-10-CM

## 2021-11-04 DIAGNOSIS — E119 Type 2 diabetes mellitus without complications: Secondary | ICD-10-CM

## 2021-11-04 LAB — CMP14+EGFR
ALT: 14 IU/L (ref 0–44)
AST: 22 IU/L (ref 0–40)
Albumin/Globulin Ratio: 1.5 (ref 1.2–2.2)
Albumin: 4.3 g/dL (ref 3.8–4.8)
Alkaline Phosphatase: 111 IU/L (ref 44–121)
BUN/Creatinine Ratio: 16 (ref 10–24)
BUN: 13 mg/dL (ref 8–27)
Bilirubin Total: 0.6 mg/dL (ref 0.0–1.2)
CO2: 26 mmol/L (ref 20–29)
Calcium: 9.3 mg/dL (ref 8.6–10.2)
Chloride: 106 mmol/L (ref 96–106)
Creatinine, Ser: 0.83 mg/dL (ref 0.76–1.27)
Globulin, Total: 2.8 g/dL (ref 1.5–4.5)
Glucose: 77 mg/dL (ref 70–99)
Potassium: 4.6 mmol/L (ref 3.5–5.2)
Sodium: 145 mmol/L — ABNORMAL HIGH (ref 134–144)
Total Protein: 7.1 g/dL (ref 6.0–8.5)
eGFR: 100 mL/min/{1.73_m2} (ref >59)

## 2021-11-04 LAB — LIPID PANEL
Chol/HDL Ratio: 2.8 ratio (ref 0.0–5.0)
Cholesterol, Total: 172 mg/dL (ref 100–199)
HDL: 61 mg/dL (ref >39)
LDL Chol Calc (NIH): 100 mg/dL — ABNORMAL HIGH (ref 0–99)
Triglycerides: 57 mg/dL (ref 0–149)
VLDL Cholesterol Cal: 11 mg/dL (ref 5–40)

## 2021-11-04 LAB — CBC
Hematocrit: 42.3 % (ref 37.5–51.0)
Hemoglobin: 13.5 g/dL (ref 13.0–17.7)
MCH: 26.9 pg (ref 26.6–33.0)
MCHC: 31.9 g/dL (ref 31.5–35.7)
MCV: 84 fL (ref 79–97)
Platelets: 255 10*3/uL (ref 150–450)
RBC: 5.02 x10E6/uL (ref 4.14–5.80)
RDW: 13.1 % (ref 11.6–15.4)
WBC: 9.6 10*3/uL (ref 3.4–10.8)

## 2021-11-04 LAB — HEMOGLOBIN A1C
Est. average glucose Bld gHb Est-mCnc: 126 mg/dL
Hgb A1c MFr Bld: 6 % — ABNORMAL HIGH (ref 4.8–5.6)

## 2021-11-04 MED ORDER — PRAVASTATIN SODIUM 80 MG PO TABS: 80.0000 mg | ORAL_TABLET | Freq: Every day | ORAL | 3 refills | Status: DC

## 2021-11-04 NOTE — Progress Notes (Signed)
Pls review labs with patient.  HLD.  LDL  level is not at goal patient should start taking pravastatin 80 mg daily. We will recheck labs at next visit.   A1c 6.0 avoid sugar sweets.SODA  Sodium level slightly elevated.  Drink plenty of fluids to stay hydrated

## 2021-11-04 NOTE — Progress Notes (Unsigned)
The 10-year ASCVD risk score (Arnett DK, et al., 2019) is: 40.2%   Values used to calculate the score:     Age: 61 years     Sex: Male     Is Non-Hispanic African American: Yes     Diabetic: Yes     Tobacco smoker: Yes     Systolic Blood Pressure: 138 mmHg     Is BP treated: Yes     HDL Cholesterol: 61 mg/dL     Total Cholesterol: 172 mg/dL

## 2021-11-05 ENCOUNTER — Telehealth: Payer: Medicare Other

## 2021-11-06 ENCOUNTER — Ambulatory Visit: Payer: Medicare Other

## 2021-11-06 DIAGNOSIS — F200 Paranoid schizophrenia: Secondary | ICD-10-CM

## 2021-11-06 DIAGNOSIS — I10 Essential (primary) hypertension: Secondary | ICD-10-CM

## 2021-11-06 DIAGNOSIS — E785 Hyperlipidemia, unspecified: Secondary | ICD-10-CM

## 2021-11-06 DIAGNOSIS — F339 Major depressive disorder, recurrent, unspecified: Secondary | ICD-10-CM

## 2021-11-06 DIAGNOSIS — E119 Type 2 diabetes mellitus without complications: Secondary | ICD-10-CM

## 2021-11-06 NOTE — Patient Instructions (Addendum)
Visit Information  Patient Goals: Emotional Health Support. Manage Depression. Manage Finances. Manage mobility  Timeframe:  Short-Term Goal Priority:  High Progress:  On Track Start Date:      11/06/21                        Expected End Date:     01/28/22                 Follow Up Date 01/01/22  at 3:00 PM     - expect call from [facility] to connect you with Psychiatrist and counselor - keep 90 percent of counseling appointments - schedule counseling appointment   Emotional Health Support. Manage Depression. Manage Finances. Manage mobility   Why is this important?   Beating depression may take some time.  If you don't feel better right away, don't give up on your treatment plan.     Patient Self-Care Activities: Over the next 30 days Attend scheduled medical appointments Take medications as prescribed Practice self care, relaxation techniques  Patient Deficits  Anxiety issues Depression issues Mobility issues  Patient Goals:  Communicate regularly with his sisters in next 30 days to discuss his needs Attend scheduled medical appointments Practice self care as needed  Follow Up Plan:  LCSW to call client on 01/01/22 at 3:00 PM to assess client needs  Kelton Pillar.Georges Victorio MSW, LCSW Licensed Visual merchandiser [facility] Care Management [phone]
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
## 2021-11-10 DIAGNOSIS — I10 Essential (primary) hypertension: Secondary | ICD-10-CM | POA: Diagnosis not present

## 2021-11-10 DIAGNOSIS — F32A Depression, unspecified: Secondary | ICD-10-CM

## 2021-11-10 DIAGNOSIS — E785 Hyperlipidemia, unspecified: Secondary | ICD-10-CM | POA: Diagnosis not present

## 2021-11-10 DIAGNOSIS — E1159 Type 2 diabetes mellitus with other circulatory complications: Secondary | ICD-10-CM

## 2021-11-10 DIAGNOSIS — F17210 Nicotine dependence, cigarettes, uncomplicated: Secondary | ICD-10-CM

## 2021-11-12 ENCOUNTER — Other Ambulatory Visit: Payer: Self-pay

## 2021-11-12 ENCOUNTER — Ambulatory Visit (INDEPENDENT_AMBULATORY_CARE_PROVIDER_SITE_OTHER): Payer: Medicare HMO

## 2021-11-12 DIAGNOSIS — Z0000 Encounter for general adult medical examination without abnormal findings: Secondary | ICD-10-CM | POA: Diagnosis not present

## 2021-11-12 NOTE — Patient Instructions (Signed)

## 2021-11-12 NOTE — Progress Notes (Signed)
Subjective:   Gregory Payne is a 61 y.o. male who presents for an Initial Medicare Annual Wellness Visit.  Review of Systems    Defer to PCP Cardiac Risk Factors include: male gender;hypertension;diabetes mellitus;smoking/ tobacco exposure     Objective:    Today's Vitals   11/12/21 1053  PainSc: 9    There is no height or weight on file to calculate BMI.  Advanced Directives 11/12/2021 10/07/2012 01/28/2012  Does Patient Have a Medical Advance Directive? No Patient does not have advance directive;Patient would not like information Patient does not have advance directive;Patient would not like information  Would patient like information on creating a medical advance directive? No - Patient declined - -  Pre-existing out of facility DNR order (yellow form or pink MOST form) - - No    Current Medications (verified) Outpatient Encounter Medications as of 11/12/2021  Medication Sig   amLODipine (NORVASC) 5 MG tablet Take 1 tablet (5 mg total) by mouth daily.   cyanocobalamin 1000 MCG tablet Take 1,000 mcg by mouth daily.   docusate sodium (COLACE) 100 MG capsule Take 400 mg by mouth daily. Reported on 01/14/2016   DULoxetine (CYMBALTA) 30 MG capsule Take 1 capsule (30 mg total) by mouth daily.   gabapentin (NEURONTIN) 300 MG capsule Take 1 capsule (300 mg total) by mouth 4 (four) times daily.   meloxicam (MOBIC) 15 MG tablet Take 15 mg by mouth daily.   OLANZapine (ZYPREXA) 5 MG tablet Take 1 tablet (5 mg total) by mouth 2 (two) times daily.   omeprazole (PRILOSEC) 20 MG capsule Take 20 mg by mouth daily as needed.   pravastatin (PRAVACHOL) 80 MG tablet Take 1 tablet (80 mg total) by mouth daily.   VITAMIN D, CHOLECALCIFEROL, PO Take by mouth.   No facility-administered encounter medications on file as of 11/12/2021.    Allergies (verified) Naproxen   History: Past Medical History:  Diagnosis Date   Anxiety    Chronic pain    Depression    Diabetes mellitus    Fibromyalgia     GERD (gastroesophageal reflux disease)    Hypercholesteremia    Hypertension    Kidney stones    Schizophrenia [facility](HCC)    Past Surgical History:  Procedure Laterality Date   CERVICAL DISC SURGERY     ESOPHAGOGASTRODUODENOSCOPY  01/28/2012   Procedure: ESOPHAGOGASTRODUODENOSCOPY (EGD);  Surgeon: Malissa HippoNajeeb U Rehman, MD;  Location: [facility];  Service: Endoscopy;  Laterality: N/A;  1200   HERNIA REPAIR     left inguinal hernia   LITHOTRIPSY     Family History  Problem Relation Age of Onset   Diabetes Mother    Kidney failure Mother    Hyperlipidemia Father    Alcoholism Father    Arthritis Daughter    Colon cancer Neg Hx    Prostate cancer Neg Hx    Social History   Socioeconomic History   Marital status: Single    Spouse name: Not on file   Number of children: 1   Years of education: 8th   Highest education level: Not on file  Occupational History   Not on file  Tobacco Use   Smoking status: Every Day    Packs/day: 0.50    Years: 35.00    Pack years: 17.50    Types: Cigarettes   Smokeless tobacco: Not on file   Tobacco comments:    He has been smoking since 61 years old. He is not ready to quit smoking.  Vaping Use   Vaping Use: Every day   Devices: he started vaping this year after his former PCP left.  Substance and Sexual Activity   Alcohol use: Yes    Comment: one 24 ounce can of beer once a week.   Drug use: Yes    Types: Marijuana    Comment: he has been using marijuana since he was 17, he has used cocaine in the past.   Sexual activity: Yes  Other Topics Concern   Not on file  Social History Narrative   Pt lives by himself, he has not been around people  because of his mental illness.    Social Determinants of Health   Financial Resource Strain: Low Risk    Difficulty of Paying Living Expenses: Not hard at all  Food Insecurity: No Food Insecurity   Worried About Programme researcher, broadcasting/film/video in the Last Year: Never true   Ran Out of Food in the Last Year:  Never true  Transportation Needs: No Transportation Needs   Lack of Transportation (Medical): No   Lack of Transportation (Non-Medical): No  Physical Activity: Insufficiently Active   Days of Exercise per Week: 1 day   Minutes of Exercise per Session: 10 min  Stress: Stress Concern Present   Feeling of Stress : To some extent  Social Connections: Socially Isolated   Frequency of Communication with Friends and Family: Twice a week   Frequency of Social Gatherings with Friends and Family: Once a week   Attends Religious Services: Never   Database administrator or Organizations: No   Attends Engineer, structural: Never   Marital Status: Divorced    Tobacco Counseling Ready to quit: Not Answered Counseling given: Not Answered Tobacco comments: He has been smoking since 61 years old. He is not ready to quit smoking.   Clinical Intake:  Pre-visit preparation completed: No  Pain : 0-10 Pain Score: 9  Pain Type: Other (Comment) Pain Location: Other (Comment) (allover arthritis pain) Pain Onset: More than a month ago Pain Frequency: Constant Pain Relieving Factors: nuerontin  Pain Relieving Factors: nuerontin  Nutritional Risks: None Diabetes: Yes CBG done?: No Did pt. bring in CBG monitor from home?: No  How often do you need to have someone help you when you read instructions, pamphlets, or other written materials from your doctor or pharmacy?: 5 - Always What is the last grade level you completed in school?: 8th  Diabetic? Nutrition Risk Assessment:  Has the patient had any N/V/D within the last 2 months?  No  Does the patient have any non-healing wounds?  No  Has the patient had any unintentional weight loss or weight gain?  No   Diabetes:  Is the patient diabetic?  Yes  If diabetic, was a CBG obtained today?  No  Did the patient bring in their glucometer from home?  No  How often do you monitor your CBG's? PRN.   Financial Strains and Diabetes  Management:  Are you having any financial strains with the device, your supplies or your medication? .  Does the patient want to beNo  seen by Chronic Care Management for management of their diabetes?  No  Would the patient like to be referred to a Nutritionist or for Diabetic Management?  No   Diabetic Exams:  Diabetic Eye Exam: Overdue for diabetic eye exam. Pt has been advised about the importance in completing this exam. Patient advised to call and schedule an eye exam. 12/29/2021  Interpreter Needed?: No  Information entered by :: Cordelia PenSherry   Activities of Daily Living In your present state of health, do you have any difficulty performing the following activities: 11/12/2021  Hearing? N  Vision? N  Difficulty concentrating or making decisions? Y  Walking or climbing stairs? Y  Dressing or bathing? Y  Doing errands, shopping? Y  Preparing Food and eating ? Y  Using the Toilet? Y  In the past six months, have you accidently leaked urine? Y  Do you have problems with loss of bowel control? N  Managing your Medications? N  Managing your Finances? Y  Housekeeping or managing your Housekeeping? Y  Some recent data might be hidden    Patient Care Team: Donell BeersPaseda, Folashade R, FNP as PCP - General (Nurse Practitioner) Gavin PoundWalston, Christopher J, [facility] Hospital[facility] (Pharmacist) Buck Mamaldwell, Janet P, LCSW as Social Worker (Licensed Clinical Social Worker) Randa SpikeForrest, Sydnee CabalMichael S, LCSW as [facility] Care Management (Licensed Clinical Social Worker)  Indicate any recent [facility] you may have received from other than [facility] providers in the past year (date may be approximate).     Assessment:   This is a routine wellness examination for FultonArnold.  Hearing/Vision screen No results found.  Dietary issues and exercise activities discussed: Current Exercise Habits: Home exercise routine, Exercise limited by: cardiac condition(s);orthopedic condition(s);psychological condition(s);respiratory  conditions(s)   Goals Addressed             This Visit's Progress    Medication Management   On track    Patient Goals/Self-Care Activities Patient will:  Take medications as prescribed Collaborate with provider on medication access solutions Come get lab work done      Depression Screen PHQ 2/9 Scores 11/12/2021 11/06/2021 10/22/2021 09/28/2021  PHQ - 2 Score 2 2 0 -  PHQ- 9 Score 10 10 - -  Exception Documentation - - - Medical reason    Fall Risk Fall Risk  11/12/2021 09/28/2021  Falls in the past year? 1 1  Number falls in past yr: 1 1  Injury with Fall? 0 1  Risk for fall due to : History of fall(s);Orthopedic patient Impaired balance/gait;Impaired mobility  Follow up Falls evaluation completed Falls evaluation completed;Education provided;Falls prevention discussed    FALL RISK PREVENTION PERTAINING TO THE HOME:  Any stairs in or around the home? Yes  If so, are there any without handrails? Yes  Home free of loose throw rugs in walkways, pet beds, electrical cords, etc? Yes  Adequate lighting in your home to reduce risk of falls? Yes   ASSISTIVE DEVICES UTILIZED TO PREVENT FALLS:  Life alert? No  Use of a cane, walker or w/c? Yes  Grab bars in the bathroom? Yes  Shower chair or bench in shower? No  Elevated toilet seat or a handicapped toilet? No   TIMED UP AND GO:  Was the test performed? Yes .  Length of time to ambulate 10 feet: 9 sec.   Gait steady and fast with assistive device  Cognitive Function:     6CIT Screen 11/12/2021  What Year? 0 points  What month? 0 points  What time? 0 points  Count back from 20 0 points  Months in reverse 4 points  Repeat phrase 2 points  Total Score 6    Immunizations There is no immunization history for the selected administration types on file for this patient.  TDAP status: Due, Education has been provided regarding the importance of this vaccine. Advised may receive this vaccine at local  pharmacy or Health  Dept. Aware to provide a copy of the vaccination record if obtained from local pharmacy or Health Dept. Verbalized acceptance and understanding.  Flu Vaccine status: Due, Education has been provided regarding the importance of this vaccine. Advised may receive this vaccine at local pharmacy or Health Dept. Aware to provide a copy of the vaccination record if obtained from local pharmacy or Health Dept. Verbalized acceptance and understanding.  Pneumococcal vaccine status: Due, Education has been provided regarding the importance of this vaccine. Advised may receive this vaccine at local pharmacy or Health Dept. Aware to provide a copy of the vaccination record if obtained from local pharmacy or Health Dept. Verbalized acceptance and understanding.  Covid-19 vaccine status: Information provided on how to obtain vaccines.   Qualifies for Shingles Vaccine? Yes   Zostavax completed No   Shingrix Completed?: No.    Education has been provided regarding the importance of this vaccine. Patient has been advised to call insurance company to determine out of pocket expense if they have not yet received this vaccine. Advised may also receive vaccine at local pharmacy or Health Dept. Verbalized acceptance and understanding.  Screening Tests Health Maintenance  Topic Date Due   COVID-19 Vaccine (1) Never done   FOOT EXAM  Never done   OPHTHALMOLOGY EXAM  Never done   URINE MICROALBUMIN  Never done   HIV Screening  Never done   Hepatitis C Screening  Never done   TETANUS/TDAP  Never done   COLONOSCOPY (Pts 45-87yrs Insurance coverage will need to be confirmed)  Never done   Zoster Vaccines- Shingrix (1 of 2) 12/27/2021 (Originally 08/02/2010)   INFLUENZA VACCINE  01/08/2022 (Originally 05/11/2021)   HEMOGLOBIN A1C  05/03/2022   HPV VACCINES  Aged Out    Health Maintenance  Health Maintenance Due  Topic Date Due   COVID-19 Vaccine (1) Never done   FOOT EXAM  Never done   OPHTHALMOLOGY EXAM  Never  done   URINE MICROALBUMIN  Never done   HIV Screening  Never done   Hepatitis C Screening  Never done   TETANUS/TDAP  Never done   COLONOSCOPY (Pts 45-20yrs Insurance coverage will need to be confirmed)  Never done    Colorectal cancer screening: Referral to GI placed 11/12/2021. Pt aware the office will call re: appt.  Lung Cancer Screening: (Low Dose CT Chest recommended if Age 71-80 years, 30 pack-year currently smoking OR have quit w/in 15years.) does not qualify.   Lung Cancer Screening Referral: n/a  Additional Screening:  Hepatitis C Screening: does qualify; Completed will complete at next office visit  Vision Screening: Recommended annual ophthalmology exams for early detection of glaucoma and other disorders of the eye. Is the patient up to date with their annual eye exam?  No  Who is the provider or what is the name of the office in which the patient attends annual eye exams? [facility] If pt is not established with a provider, would they like to be referred to a provider to establish care? No .   Dental Screening: Recommended annual dental exams for proper oral hygiene  Community Resource Referral / Chronic Care Management: CRR required this visit?  No   CCM required this visit?  No      Plan:     I have personally reviewed and noted the following in the patients chart:   Medical and social history Use of alcohol, tobacco or illicit drugs  Current medications and supplements including  opioid prescriptions. Patient is not currently taking opioid prescriptions. Functional ability and status Nutritional status Physical activity Advanced directives List of other physicians Hospitalizations, surgeries, and ER visits in previous 12 months Vitals Screenings to include cognitive, depression, and falls Referrals and appointments  In addition, I have reviewed and discussed with patient certain preventive protocols, quality metrics, and best practice recommendations.  A written personalized care plan for preventive services as well as general preventive health recommendations were provided to patient.     Glendale Chard, CMA   11/12/2021   Nurse Notes:  Mr. Swamy , Thank you for taking time to come for your Medicare Wellness Visit. I appreciate your ongoing commitment to your health goals. Please review the following plan we discussed and let me know if I can assist you in the future.   These are the goals we discussed:  Goals       Connect with mental health support/providers to better manage mental health needs (pt-stated)      Timeframe:  Short-Term Goal Priority:  High Progress:  On Track Start Date:      11/06/21                        Expected End Date:     01/28/22                 Follow Up Date 01/01/22  at 3:00 PM     - expect call from [facility] to connect you with Psychiatrist and counselor - keep 90 percent of counseling appointments - schedule counseling appointment    Why is this important?   Beating depression may take some time.  If you don't feel better right away, don't give up on your treatment plan.     Patient Self-Care Activities: Over the next 30 days Attend scheduled medical appointments Take medications as prescribed Practice self care, relaxation techniques  Patient Deficits  Anxiety issues Depression issues Mobility issues  Patient Goals:  Communicate regularly with his sisters in next 30 days to discuss his needs Attend scheduled medical appointments Practice self care as needed  Follow Up Plan:  LCSW to call client on 01/01/22 at 3:00 PM to assess client needs      Medication Management      Patient Goals/Self-Care Activities Patient will:  Take medications as prescribed Collaborate with provider on medication access solutions Come get lab work done        This is a list of the screening recommended for you and due dates:  Health Maintenance  Topic Date Due   COVID-19 Vaccine (1) Never  done   Complete foot exam   Never done   Eye exam for diabetics  Never done   Urine Protein Check  Never done   HIV Screening  Never done   Hepatitis C Screening: USPSTF Recommendation to screen - Ages 31-79 yo.  Never done   Tetanus Vaccine  Never done   Colon Cancer Screening  Never done   Zoster (Shingles) Vaccine (1 of 2) 12/27/2021*   Flu Shot  01/08/2022*   Hemoglobin A1C  05/03/2022   HPV Vaccine  Aged Out  *Topic was postponed. The date shown is not the original due date.

## 2021-11-25 ENCOUNTER — Telehealth: Payer: Self-pay | Admitting: Pharmacist

## 2021-11-25 ENCOUNTER — Telehealth: Payer: Medicare Other

## 2021-11-25 NOTE — Telephone Encounter (Signed)
°  Care Management   Follow Up Note  11/25/2021 Name: Gregory Payne MRN: [id number] DOB: 11-Nov-1960  Referred by: Renee Rival, FNP Reason for referral : Chronic Care Management (Unsuccessful Telephone Outreach)  An unsuccessful telephone outreach was attempted today. The patient was referred to the case management team for assistance with care management and care coordination.   Follow Up Plan: The care management team will reach out to the patient again over the next 7 days.   Kennon Holter, PharmD, Para [place], CPP Clinical Pharmacist Practitioner [facility] [phone]

## 2021-12-02 ENCOUNTER — Telehealth: Payer: Self-pay

## 2021-12-02 NOTE — Telephone Encounter (Signed)
FL2 Form  Copied Noted sleeved

## 2021-12-04 DIAGNOSIS — Z0279 Encounter for issue of other medical certificate: Secondary | ICD-10-CM

## 2021-12-04 NOTE — Telephone Encounter (Signed)
Called and will pick up forms.

## 2021-12-08 ENCOUNTER — Other Ambulatory Visit: Payer: Self-pay

## 2021-12-08 ENCOUNTER — Ambulatory Visit: Payer: Medicare HMO

## 2021-12-08 LAB — HM DIABETES EYE EXAM

## 2021-12-09 NOTE — Telephone Encounter (Signed)
Gregory Payne pick up forms ?

## 2021-12-15 ENCOUNTER — Other Ambulatory Visit: Payer: Self-pay | Admitting: Nurse Practitioner

## 2021-12-15 DIAGNOSIS — F200 Paranoid schizophrenia: Secondary | ICD-10-CM

## 2021-12-28 ENCOUNTER — Telehealth: Payer: Self-pay | Admitting: *Deleted

## 2021-12-28 NOTE — Chronic Care Management (AMB) (Signed)
?  Care Management  ? ?Note ? ?12/28/2021 ?Name: Gregory Payne MRN: [id number] DOB: 23-Jul-1961 ? ?Gregory Payne is a 61 y.o. year old male who is a primary care patient of Donell Beers, FNP and is actively engaged with the care management team. I reached out to Tito Dine by phone today to assist with re-scheduling a follow up visit with the Pharmacist ? ?Follow up plan: ?Unsuccessful telephone outreach attempt made. A HIPAA compliant phone message was left for the patient providing contact information and requesting a return call.  ?The care management team will reach out to the patient again over the next 7 days.  ?If patient returns call to provider office, please advise to call Embedded Care Management Care Guide Misty Stanley at [phone]. ? ?Gwenevere Ghazi  ?Care Guide, Embedded Care Coordination ?[place]  Care Management  ?Direct Dial: [phone] ? ?

## 2021-12-29 ENCOUNTER — Ambulatory Visit: Payer: Medicare Other | Admitting: Nurse Practitioner

## 2021-12-31 NOTE — Chronic Care Management (AMB) (Signed)
?  Care Management  ? ?Note ? ?12/31/2021 ?Name: Gregory Payne MRN: [id number] DOB: 28-Nov-1960 ? ?Gregory Payne is a 61 y.o. year old male who is a primary care patient of Donell Beers, FNP and is actively engaged with the care management team. I reached out to Tito Dine by phone today to assist with re-scheduling a follow up visit with the Pharmacist ? ?Follow up plan: ?Unsuccessful telephone outreach attempt made. A HIPAA compliant phone message was left for the patient providing contact information and requesting a return call.  ?The care management team will reach out to the patient again over the next 7 days.  ?If patient returns call to provider office, please advise to call Embedded Care Management Care Guide Misty Stanley at [phone]. ? ?Gwenevere Ghazi  ?Care Guide, Embedded Care Coordination ?[place]  Care Management  ?Direct Dial: [phone] ? ?

## 2022-01-01 ENCOUNTER — Telehealth: Payer: Medicare Other

## 2022-01-01 ENCOUNTER — Telehealth: Payer: Self-pay | Admitting: Licensed Clinical Social Worker

## 2022-01-01 NOTE — Telephone Encounter (Signed)
?  Chronic Care Management  ?  ? Clinical Social Work Note ?  ?01/01/22 ?Name: Gregory Payne      MRN: [id number]       DOB: Feb 22, 1961 ?  ?Gregory Payne is a 61 y.o. year old male who is a primary care patient of Donell Beers, FNP. The CCM team was consulted to assist the patient with chronic disease management and/or care coordination needs related to: [employer] .  ?  ?LCSW called client home phone number 2 times today but LCSW was not able to speak with client via phone today. LCSW did leave phone message for client asking him to please call LCSW at [phone] ? ?Follow Up Plan:  LCSW to call client on 02/19/22 at 2:00 PM ? ?Kelton Pillar.Ramia Sidney MSW, LCSW ?Licensed Clinical Social Worker ?[facility] Care Management ?[phone] ?

## 2022-01-04 NOTE — Chronic Care Management (AMB) (Signed)
?  Care Management  ? ?Note ? ?01/04/2022 ?Name: Gregory Payne MRN: [id number] DOB: Feb 17, 1961 ? ?Gregory Payne is a 61 y.o. year old male who is a primary care patient of Donell Beers, FNP and is actively engaged with the care management team. I reached out to Tito Dine by phone today to assist with re-scheduling a follow up visit with the Pharmacist ? ?Follow up plan: ?A third unsuccessful telephone outreach attempt made. Unable to make contact on outreach attempts x 3. PCP Donell Beers, FNP notified via routed documentation in medical record. We have been unable to make contact with the patient for follow up. The care management team is available to follow up with the patient after provider conversation with the patient regarding recommendation for care management engagement and subsequent re-referral to the care management team.  ? ? ?Gwenevere Ghazi  ?Care Guide, Embedded Care Coordination ?[place]  Care Management  ?Direct Dial: [phone] ? ?

## 2022-01-06 ENCOUNTER — Other Ambulatory Visit: Payer: Self-pay | Admitting: Nurse Practitioner

## 2022-01-06 DIAGNOSIS — M79605 Pain in left leg: Secondary | ICD-10-CM

## 2022-01-22 ENCOUNTER — Telehealth: Payer: Self-pay

## 2022-01-22 NOTE — Telephone Encounter (Signed)
Please advise 

## 2022-01-22 NOTE — Telephone Encounter (Signed)
Katie clinical intake specialist called from [facility] saying patient been trying to get authorization for home health. Katie call back # [phone]. ?

## 2022-01-25 NOTE — Telephone Encounter (Signed)
Can you schedule this patient with Mitzi Davenport, next available appt pls to address request for home health ?

## 2022-01-25 NOTE — Telephone Encounter (Signed)
Left voicemail to call office to confirm appt Friday/ 4/21 ?

## 2022-01-27 NOTE — Telephone Encounter (Signed)
Called patient left voicemail to confirm appt again and also mailed out appointment reminder letter on Wednesday, 04.18.2023. ?

## 2022-01-29 ENCOUNTER — Ambulatory Visit: Payer: Medicare HMO | Admitting: Nurse Practitioner

## 2022-02-01 ENCOUNTER — Other Ambulatory Visit: Payer: Self-pay | Admitting: Internal Medicine

## 2022-02-01 ENCOUNTER — Other Ambulatory Visit: Payer: Self-pay | Admitting: Nurse Practitioner

## 2022-02-01 DIAGNOSIS — F200 Paranoid schizophrenia: Secondary | ICD-10-CM

## 2022-02-19 ENCOUNTER — Telehealth: Payer: Medicare HMO

## 2022-02-25 ENCOUNTER — Other Ambulatory Visit: Payer: Self-pay | Admitting: Nurse Practitioner

## 2022-02-25 DIAGNOSIS — I10 Essential (primary) hypertension: Secondary | ICD-10-CM

## 2022-02-25 MED ORDER — VORTIOXETINE HBR 10 MG PO TABS: 10.0000 mg | ORAL_TABLET | Freq: Every day | ORAL | 0 refills | Status: DC

## 2022-02-25 MED ORDER — AMLODIPINE BESYLATE 5 MG PO TABS: 5.0000 mg | ORAL_TABLET | Freq: Every day | ORAL | 1 refills | Status: DC

## 2022-03-02 ENCOUNTER — Ambulatory Visit: Payer: Medicare HMO | Admitting: Nurse Practitioner

## 2022-03-11 ENCOUNTER — Encounter: Payer: Self-pay | Admitting: Nurse Practitioner

## 2022-03-11 ENCOUNTER — Ambulatory Visit (INDEPENDENT_AMBULATORY_CARE_PROVIDER_SITE_OTHER): Payer: Medicare HMO | Admitting: Nurse Practitioner

## 2022-03-11 VITALS — BP 124/76 | HR 82 | Ht 75.0 in | Wt 168.0 lb

## 2022-03-11 DIAGNOSIS — M79605 Pain in left leg: Secondary | ICD-10-CM

## 2022-03-11 DIAGNOSIS — E785 Hyperlipidemia, unspecified: Secondary | ICD-10-CM

## 2022-03-11 DIAGNOSIS — I10 Essential (primary) hypertension: Secondary | ICD-10-CM | POA: Diagnosis not present

## 2022-03-11 DIAGNOSIS — F339 Major depressive disorder, recurrent, unspecified: Secondary | ICD-10-CM

## 2022-03-11 DIAGNOSIS — Z1211 Encounter for screening for malignant neoplasm of colon: Secondary | ICD-10-CM

## 2022-03-11 DIAGNOSIS — E119 Type 2 diabetes mellitus without complications: Secondary | ICD-10-CM

## 2022-03-11 DIAGNOSIS — F17200 Nicotine dependence, unspecified, uncomplicated: Secondary | ICD-10-CM

## 2022-03-11 DIAGNOSIS — K5900 Constipation, unspecified: Secondary | ICD-10-CM | POA: Insufficient documentation

## 2022-03-11 DIAGNOSIS — F200 Paranoid schizophrenia: Secondary | ICD-10-CM | POA: Diagnosis not present

## 2022-03-11 MED ORDER — OLANZAPINE 5 MG PO TABS: ORAL_TABLET | ORAL | 3 refills | Status: DC

## 2022-03-11 MED ORDER — VORTIOXETINE HBR 5 MG PO TABS: 5.0000 mg | ORAL_TABLET | Freq: Every day | ORAL | 0 refills | Status: DC

## 2022-03-11 MED ORDER — DULOXETINE HCL 30 MG PO CPEP: 30.0000 mg | ORAL_CAPSULE | Freq: Every day | ORAL | 1 refills | Status: DC

## 2022-03-11 MED ORDER — DOCUSATE SODIUM 100 MG PO CAPS
100.0000 mg | ORAL_CAPSULE | Freq: Every day | ORAL | 1 refills | Status: AC | PRN
Start: 2022-03-11 — End: ?

## 2022-03-11 MED ORDER — GABAPENTIN 400 MG PO CAPS: 400.0000 mg | ORAL_CAPSULE | Freq: Four times a day (QID) | ORAL | 2 refills | Status: DC

## 2022-03-11 NOTE — Assessment & Plan Note (Signed)
BP Readings from Last 3 Encounters:  03/11/22 124/76  09/28/21 138/88  01/15/16 (!) 147/85  Chronic condition well-controlled on amlodipine 5 mg Patient encouraged to take medications daily as prescribed DASH diet advised engage in regular exercise at least 150 minutes weekly as tolerated CMP today Follow-up in 4 months

## 2022-03-11 NOTE — Progress Notes (Signed)
Gregory Payne     MRN: [id number]      DOB: [birth year]   HPI Mr. Gregory Payne with past medical history of hypertension, diabetes mellitus without complication, schizophrenia, anxiety, erectile dysfunction is here for follow up and re-evaluation of chronic medical conditions, medication management  Hypertension .patient stated that has been taking amlodipine 5mg  4 times a week due to erectile dysfunction, denies chest pain,edema.    Hyperlipidemia.  Patient stated that he has stopped taking pravatain , because he does not want to be taking too many medications, patient could not remember the last time he took pravastatin.   Depression. States that he has not been taking Cymbalta, does not want to take too many medication, he wants to be taking Trintellix only and want dose of trintelix reduced to 5 mg daily, states that the medication was too strong for him.    Constipation. Feels constipated most of the times, has not been drinking enough water but eats plenty of vegetables. Has been out of colace. Denies abdominal pain , bloody stool.    Chronic leg pain.  Currently on gabapentin 300 mg 4 times daily, would like medication increased to 400 mg 4 times daily, states that current dose is not getting his pain under control, continues to wear left leg brace   Due for foot exam patient deferred foot exam to next visit Due for Tdap vaccine and shingles vaccine need for both vaccines discussed with patient patient encouraged to get vaccines at his pharmacy.  Cologuard screening for colon cancer ordered today    ROS Denies recent fever or chills. Denies sinus pressure, nasal congestion, ear pain or sore throat. Denies chest congestion, productive cough or wheezing. Denies chest pains, palpitations and leg swelling Denies abdominal pain, nausea, vomiting,diarrhea or constipation.   Denies dysuria, frequency, hesitancy or incontinence. Denies headaches, seizures, numbness, or  tingling. Denies depression, anxiety or insomnia.    PE  BP 124/76 (BP Location: Right Arm, Patient Position: Sitting, Cuff Size: Normal)   Pulse 82   Ht 6\' 3"  (1.905 m)   Wt 168 lb (76.2 kg)   SpO2 93%   BMI 21.00 kg/m   Patient alert and oriented and in no cardiopulmonary distress. . Chest: Clear to auscultation bilaterally.  CVS: S1, S2 no murmurs, no S3.Regular rate.  ABD: Soft non tender.   Ext: No edema  MS: Adequate ROM spine, shoulders, hips and knees.  Uses a cane has brace on left leg.    Psych: Good eye contact, normal affect. Memory intact not anxious or depressed appearing.    Assessment & Plan  DISORDER, TOBACCO USE Smokes about half a  pack/day  Asked about quitting: confirms that he/she currently smokes cigarettes Advise to quit smoking: Educated about QUITTING to reduce the risk of cancer, cardio and cerebrovascular disease. Assess willingness: Unwilling to quit at this time, but is working on cutting back. Assist with counseling and pharmacotherapy: Counseled for 5 minutes and literature provided. Arrange for follow up: follow up in 4  months and continue to offer help.  Essential hypertension BP Readings from Last 3 Encounters:  03/11/22 124/76  09/28/21 138/88  01/15/16 (!) 147/85  Chronic condition well-controlled on amlodipine 5 mg Patient encouraged to take medications daily as prescribed DASH diet advised engage in regular exercise at least 150 minutes weekly as tolerated CMP today Follow-up in 4 months  Diabetes mellitus without complication (HCC) Currently not on medication Check A1c, urine creatinine labs ordered Avoid sugar sweets order  Deferred foot exam to next visit  SCHIZOPHRENIA, PARANOID, CHRONIC Chronic condition On Zyprexa 5 mg twice daily Patient referred to psych for med management  Hyperlipidemia The 10-year ASCVD risk score (Arnett DK, et al., 2019) is: 34.2%   Values used to calculate the score:     Age: 61  years     Sex: Male     Is Non-Hispanic African American: Yes     Diabetic: Yes     Tobacco smoker: Yes     Systolic Blood Pressure: 124 mmHg     Is BP treated: Yes     HDL Cholesterol: 61 mg/dL     Total Cholesterol: 172 mg/dL  Patient stated that he has stopped taking pravastatin because he does not want to be on too many medication Need to take pravastatin 80 mg daily as ordered discussed with patient He verbalized understanding Lipid panel ordered LDL goal is less than 70  Depression, recurrent (HCC) PHQ-9 score 0 Denies SI, HI He has stopped taking duloxetine and does not want medication refill Currently taking Trintellix 10 mg daily, once medication decreased to 5 mg daily Rx Trintellix 5 mg daily Follow-up in 4 months  Constipation Chronic condition Rx doxycycline 100 mg daily as needed Patient encouraged to increase intake of fiber drink at least 64 ounces of water daily and engage in regular exercise  Left leg pain Chronic condition Currently taking gabapentin 300 mg four times daily  Dose increased to 400 mg 4 times daily to help manage pain better,

## 2022-03-11 NOTE — Assessment & Plan Note (Signed)
Chronic condition On Zyprexa 5 mg twice daily Patient referred to psych for med management

## 2022-03-11 NOTE — Assessment & Plan Note (Signed)
Currently not on medication Check A1c, urine creatinine labs ordered Avoid sugar sweets order Deferred foot exam to next visit

## 2022-03-11 NOTE — Assessment & Plan Note (Signed)
Chronic condition Rx doxycycline 100 mg daily as needed Patient encouraged to increase intake of fiber drink at least 64 ounces of water daily and engage in regular exercise

## 2022-03-11 NOTE — Assessment & Plan Note (Signed)
Smokes about half a  pack/day  Asked about quitting: confirms that he/she currently smokes cigarettes Advise to quit smoking: Educated about QUITTING to reduce the risk of cancer, cardio and cerebrovascular disease. Assess willingness: Unwilling to quit at this time, but is working on cutting back. Assist with counseling and pharmacotherapy: Counseled for 5 minutes and literature provided. Arrange for follow up: follow up in 4  months and continue to offer help.

## 2022-03-11 NOTE — Assessment & Plan Note (Signed)
The 10-year ASCVD risk score (Arnett DK, et al., 2019) is: 34.2%   Values used to calculate the score:     Age: 61 years     Sex: Male     Is Non-Hispanic African American: Yes     Diabetic: Yes     Tobacco smoker: Yes     Systolic Blood Pressure: 124 mmHg     Is BP treated: Yes     HDL Cholesterol: 61 mg/dL     Total Cholesterol: 172 mg/dL  Patient stated that he has stopped taking pravastatin because he does not want to be on too many medication Need to take pravastatin 80 mg daily as ordered discussed with patient He verbalized understanding Lipid panel ordered LDL goal is less than 70

## 2022-03-11 NOTE — Assessment & Plan Note (Signed)
PHQ-9 score 0 Denies SI, HI He has stopped taking duloxetine and does not want medication refill Currently taking Trintellix 10 mg daily, once medication decreased to 5 mg daily Rx Trintellix 5 mg daily Follow-up in 4 months

## 2022-03-11 NOTE — Assessment & Plan Note (Signed)
Chronic condition Currently taking gabapentin 300 mg four times daily  Dose increased to 400 mg 4 times daily to help manage pain better,

## 2022-03-11 NOTE — Patient Instructions (Addendum)
Please get your TDAP vaccine and shingles vaccine at your pharmacy   Please take start taking trintelix 5mg  daily your depression   Please start taking gabapentin 400mg  4 times daily for your chronic pain   For constipation it is important that you have an adequate intake of fruit and vegetables daily, at least 3 servings of each, as well as water intake of at least 48 ounces daily and regular exercise.  OTC stool softeners are helpful for daily use, up to 4 daily (eg. Colace)  Fiber intake daily is needed, in the form of Bran or Shredded Wheat     It is important that you exercise regularly at least 30 minutes 5 times a week.  Think about what you will eat, plan ahead. Choose " clean, green, fresh or frozen" over canned, processed or packaged foods which are more sugary, salty and fatty. 70 to 75% of food eaten should be vegetables and fruit. Three meals at set times with snacks allowed between meals, but they must be fruit or vegetables. Aim to eat over a 12 hour period , example 7 am to 7 pm, and STOP after  your last meal of the day. Drink water,generally about 64 ounces per day, no other drink is as healthy. Fruit juice is best enjoyed in a healthy way, by EATING the fruit.  Thanks for choosing [facility], we consider it a privelige to serve you.

## 2022-03-12 ENCOUNTER — Telehealth: Payer: Self-pay | Admitting: *Deleted

## 2022-03-12 LAB — LIPID PANEL: Chol/HDL Ratio: 3.8 ratio (ref 0.0–5.0)

## 2022-03-12 LAB — CMP14+EGFR
Albumin: 4.5 g/dL (ref 3.8–4.8)
Creatinine, Ser: 1 mg/dL (ref 0.76–1.27)
eGFR: 86 mL/min/{1.73_m2} (ref >59)

## 2022-03-12 LAB — MICROALBUMIN / CREATININE URINE RATIO

## 2022-03-12 NOTE — Chronic Care Management (AMB) (Signed)
  Chronic Care Management   Outreach Note  03/12/2022 Name: Gregory Payne MRN: [id number] DOB: [birth year]  Gregory Payne is a 61 y.o. year old male who is a primary care patient of Donell Beers, FNP. I reached out to Gregory Payne by phone today in response to a referral sent by Gregory Payne's primary care provider.  An unsuccessful telephone outreach was attempted today. The patient was referred to the case management team for assistance with care management and care coordination.   Follow Up Plan: A HIPAA compliant phone message was left for the patient providing contact information and requesting a return call.  The care management team will reach out to the patient again over the next 7 days.  If patient returns call to provider office, please advise to call [facility] Management Care Guide Gregory Payne* at [phone].Gwenevere Ghazi  Care Guide, Embedded Care Coordination [facility]  Direct Dial: [phone]

## 2022-03-13 LAB — CMP14+EGFR
ALT: 16 IU/L (ref 0–44)
AST: 19 IU/L (ref 0–40)
Albumin/Globulin Ratio: 1.6 (ref 1.2–2.2)
Alkaline Phosphatase: 87 IU/L (ref 44–121)
BUN/Creatinine Ratio: 19 (ref 10–24)
BUN: 19 mg/dL (ref 8–27)
Bilirubin Total: 0.6 mg/dL (ref 0.0–1.2)
CO2: 23 mmol/L (ref 20–29)
Calcium: 9.6 mg/dL (ref 8.6–10.2)
Chloride: 111 mmol/L — ABNORMAL HIGH (ref 96–106)
Globulin, Total: 2.9 g/dL (ref 1.5–4.5)
Glucose: 98 mg/dL (ref 70–99)
Potassium: 4.5 mmol/L (ref 3.5–5.2)
Sodium: 150 mmol/L — ABNORMAL HIGH (ref 134–144)
Total Protein: 7.4 g/dL (ref 6.0–8.5)

## 2022-03-13 LAB — LIPID PANEL
Cholesterol, Total: 181 mg/dL (ref 100–199)
HDL: 48 mg/dL (ref >39)
LDL Chol Calc (NIH): 123 mg/dL — ABNORMAL HIGH (ref 0–99)
Triglycerides: 52 mg/dL (ref 0–149)
VLDL Cholesterol Cal: 10 mg/dL (ref 5–40)

## 2022-03-13 LAB — MICROALBUMIN / CREATININE URINE RATIO
Microalb/Creat Ratio: 139 mg/g creat — ABNORMAL HIGH (ref 0–29)
Microalbumin, Urine: 187.5 ug/mL

## 2022-03-13 LAB — HEMOGLOBIN A1C
Est. average glucose Bld gHb Est-mCnc: 123 mg/dL
Hgb A1c MFr Bld: 5.9 % — ABNORMAL HIGH (ref 4.8–5.6)

## 2022-03-16 ENCOUNTER — Encounter: Payer: Self-pay | Admitting: Nurse Practitioner

## 2022-03-16 NOTE — Chronic Care Management (AMB) (Signed)
  Care Management   Outreach Note  03/16/2022 Name: Gregory Payne MRN: [id number] DOB: [birth year]  Referred by: Renee Rival, FNP Reason for referral : Chronic Care Management (Outreach to schedule initial referral with SW)   A second unsuccessful telephone outreach was attempted today. The patient was referred to the case management team for assistance with care management and care coordination.   Follow Up Plan:  The care management team will reach out to the patient again over the next 7 days.  If patient returns call to provider office, please advise to call [place]* at [phone].*  [facility]  Direct Dial: [phone]

## 2022-03-18 NOTE — Chronic Care Management (AMB) (Signed)
  Chronic Care Management   Outreach Note  03/18/2022 Name: Gregory Payne MRN: [id number] DOB: 11/15/1960  Gregory Payne is a 61 y.o. year old male who is a primary care patient of Renee Rival, FNP. I reached out to Gregory Payne by phone today in response to a referral sent by Mr. Gregory Payne's primary care provider.  Third unsuccessful telephone outreach was attempted today. The patient was referred to the case management team for assistance with care management and care coordination. The patient's primary care provider has been notified of our unsuccessful attempts to make or maintain contact with the patient. The care management team is pleased to engage with this patient at any time in the future should he/she be interested in assistance from the care management team.   Follow Up Plan: We have been unable to make contact with the patient for follow up. The care management team is available to follow up with the patient after provider conversation with the patient regarding recommendation for care management engagement and subsequent re-referral to the care management team.   [facility]  Direct Dial: [phone]

## 2022-03-31 ENCOUNTER — Other Ambulatory Visit: Payer: Self-pay | Admitting: Nurse Practitioner

## 2022-04-22 ENCOUNTER — Other Ambulatory Visit: Payer: Self-pay | Admitting: Internal Medicine

## 2022-05-27 ENCOUNTER — Other Ambulatory Visit: Payer: Self-pay | Admitting: Nurse Practitioner

## 2022-06-28 ENCOUNTER — Other Ambulatory Visit: Payer: Self-pay | Admitting: Nurse Practitioner

## 2022-07-13 ENCOUNTER — Ambulatory Visit (INDEPENDENT_AMBULATORY_CARE_PROVIDER_SITE_OTHER): Payer: Medicare HMO | Admitting: Family Medicine

## 2022-07-13 ENCOUNTER — Encounter: Payer: Self-pay | Admitting: Family Medicine

## 2022-07-13 VITALS — BP 128/72 | HR 96 | Ht 75.0 in | Wt 167.0 lb

## 2022-07-13 DIAGNOSIS — Z0001 Encounter for general adult medical examination with abnormal findings: Secondary | ICD-10-CM

## 2022-07-13 DIAGNOSIS — E039 Hypothyroidism, unspecified: Secondary | ICD-10-CM

## 2022-07-13 DIAGNOSIS — E559 Vitamin D deficiency, unspecified: Secondary | ICD-10-CM | POA: Diagnosis not present

## 2022-07-13 DIAGNOSIS — Z114 Encounter for screening for human immunodeficiency virus [HIV]: Secondary | ICD-10-CM | POA: Diagnosis not present

## 2022-07-13 DIAGNOSIS — Z1159 Encounter for screening for other viral diseases: Secondary | ICD-10-CM | POA: Diagnosis not present

## 2022-07-13 DIAGNOSIS — E7849 Other hyperlipidemia: Secondary | ICD-10-CM | POA: Diagnosis not present

## 2022-07-13 DIAGNOSIS — R7301 Impaired fasting glucose: Secondary | ICD-10-CM

## 2022-07-13 DIAGNOSIS — Z1211 Encounter for screening for malignant neoplasm of colon: Secondary | ICD-10-CM | POA: Diagnosis not present

## 2022-07-13 DIAGNOSIS — M21372 Foot drop, left foot: Secondary | ICD-10-CM | POA: Insufficient documentation

## 2022-07-13 DIAGNOSIS — E119 Type 2 diabetes mellitus without complications: Secondary | ICD-10-CM | POA: Diagnosis not present
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
## 2022-07-13 NOTE — Assessment & Plan Note (Signed)
We will get labs today to assess patient's overall health as patient has not had a recent lab this year

## 2022-07-13 NOTE — Patient Instructions (Addendum)
I appreciate the opportunity to provide care to you today!    Follow up:  3 months  Fasting labs: please stop by the lab during to get your blood drawn (CBC, CMP, TSH, Lipid profile, HgA1c, Vit D)  Screening: HIV and Hep C   Referrals today-  Dr. Luna Glasgow   Please continue to a heart-healthy diet and increase your physical activities. Try to exercise for 2mins at least three times a week.      It was a pleasure to see you and I look forward to continuing to work together on your health and well-being. Please do not hesitate to call the office if you need care or have questions about your care.   Have a wonderful day and week. With Gratitude, Alvira Monday MSN, FNP-BC

## 2022-07-13 NOTE — Assessment & Plan Note (Signed)
Referral placed to orthopedic surgery for left foot drop The patient has had multiple falls this year from instability of his current foot brace He does wear a knee brace on the left knee Physical exam findings include abnormal gait positive Romberg abnormal heel-to-shin test

## 2022-07-13 NOTE — Assessment & Plan Note (Signed)
Physical exam as documented Counseling is done on healthy lifestyle involving commitment to 150 minutes of exercise per week,  Discussed heart-healthy diet and attaining a healthy weight Changes in health habits are decided on by the patient with goals and time frames set for achieving them. Immunization and cancer screening needs are specifically addressed at this visit     

## 2022-07-15 ENCOUNTER — Encounter: Payer: Self-pay | Admitting: *Deleted

## 2022-07-21 ENCOUNTER — Encounter: Payer: Self-pay | Admitting: Radiology

## 2022-07-28 ENCOUNTER — Other Ambulatory Visit: Payer: Self-pay | Admitting: Nurse Practitioner

## 2022-07-28 ENCOUNTER — Other Ambulatory Visit: Payer: Self-pay | Admitting: Internal Medicine

## 2022-07-28 DIAGNOSIS — F200 Paranoid schizophrenia: Secondary | ICD-10-CM

## 2022-07-28 NOTE — Telephone Encounter (Signed)
You can refill

## 2022-07-28 NOTE — Telephone Encounter (Signed)
Please advise 

## 2022-08-06 NOTE — Addendum Note (Signed)
Addended byAlvira Monday on: 08/06/2022 05:41 PM   Modules accepted: Level of Service

## 2022-08-31 ENCOUNTER — Other Ambulatory Visit: Payer: Self-pay | Admitting: Family Medicine

## 2022-08-31 ENCOUNTER — Other Ambulatory Visit: Payer: Self-pay | Admitting: Nurse Practitioner

## 2022-08-31 DIAGNOSIS — F200 Paranoid schizophrenia: Secondary | ICD-10-CM

## 2022-08-31 DIAGNOSIS — I10 Essential (primary) hypertension: Secondary | ICD-10-CM

## 2022-09-30 ENCOUNTER — Other Ambulatory Visit: Payer: Self-pay | Admitting: Family Medicine

## 2022-09-30 ENCOUNTER — Other Ambulatory Visit: Payer: Self-pay | Admitting: Nurse Practitioner

## 2022-09-30 DIAGNOSIS — F339 Major depressive disorder, recurrent, unspecified: Secondary | ICD-10-CM

## 2022-09-30 DIAGNOSIS — F200 Paranoid schizophrenia: Secondary | ICD-10-CM

## 2022-10-05 NOTE — Telephone Encounter (Signed)
Kindly refill

## 2022-10-15 ENCOUNTER — Ambulatory Visit: Payer: Medicare HMO | Admitting: Family Medicine

## 2022-10-15 ENCOUNTER — Telehealth: Payer: Self-pay | Admitting: Family Medicine

## 2022-10-15 NOTE — Telephone Encounter (Signed)
Kindly send a letter

## 2022-10-15 NOTE — Telephone Encounter (Signed)
Good afternoon, patient has missed 4 appts (12/29/21, 01/29/22, 03/02/22, 10/15/22). How would you like to proceed?

## 2022-10-18 ENCOUNTER — Encounter: Payer: Self-pay | Admitting: Family Medicine

## 2022-10-26 ENCOUNTER — Other Ambulatory Visit: Payer: Self-pay | Admitting: Family Medicine

## 2022-11-01 ENCOUNTER — Other Ambulatory Visit: Payer: Self-pay | Admitting: Family Medicine

## 2022-11-01 DIAGNOSIS — F339 Major depressive disorder, recurrent, unspecified: Secondary | ICD-10-CM

## 2022-11-01 DIAGNOSIS — F200 Paranoid schizophrenia: Secondary | ICD-10-CM

## 2022-11-03 ENCOUNTER — Other Ambulatory Visit: Payer: Self-pay | Admitting: Family Medicine

## 2022-11-03 DIAGNOSIS — F339 Major depressive disorder, recurrent, unspecified: Secondary | ICD-10-CM

## 2022-11-03 DIAGNOSIS — F200 Paranoid schizophrenia: Secondary | ICD-10-CM

## 2022-11-03 MED ORDER — OLANZAPINE 5 MG PO TABS: ORAL_TABLET | ORAL | 0 refills | Status: DC

## 2022-11-11 ENCOUNTER — Telehealth: Payer: Self-pay | Admitting: Family Medicine

## 2022-11-11 NOTE — Telephone Encounter (Signed)
FL2 form  Copied Noted Sleeved  Call [employer] worker at [phone] ex [phone]. When ready.

## 2022-11-16 ENCOUNTER — Other Ambulatory Visit: Payer: Self-pay | Admitting: Family Medicine

## 2022-11-16 ENCOUNTER — Encounter: Payer: Self-pay | Admitting: Family Medicine

## 2022-11-16 ENCOUNTER — Encounter: Payer: Self-pay | Admitting: *Deleted

## 2022-11-16 ENCOUNTER — Ambulatory Visit (INDEPENDENT_AMBULATORY_CARE_PROVIDER_SITE_OTHER): Payer: Medicare HMO | Admitting: Family Medicine

## 2022-11-16 VITALS — BP 124/82 | HR 79 | Ht 75.0 in | Wt 187.1 lb

## 2022-11-16 DIAGNOSIS — E038 Other specified hypothyroidism: Secondary | ICD-10-CM | POA: Diagnosis not present

## 2022-11-16 DIAGNOSIS — E7849 Other hyperlipidemia: Secondary | ICD-10-CM

## 2022-11-16 DIAGNOSIS — F200 Paranoid schizophrenia: Secondary | ICD-10-CM

## 2022-11-16 DIAGNOSIS — E119 Type 2 diabetes mellitus without complications: Secondary | ICD-10-CM | POA: Diagnosis not present

## 2022-11-16 DIAGNOSIS — I10 Essential (primary) hypertension: Secondary | ICD-10-CM | POA: Diagnosis not present

## 2022-11-16 DIAGNOSIS — R7301 Impaired fasting glucose: Secondary | ICD-10-CM

## 2022-11-16 DIAGNOSIS — F339 Major depressive disorder, recurrent, unspecified: Secondary | ICD-10-CM

## 2022-11-16 DIAGNOSIS — E559 Vitamin D deficiency, unspecified: Secondary | ICD-10-CM

## 2022-11-16 DIAGNOSIS — Z1211 Encounter for screening for malignant neoplasm of colon: Secondary | ICD-10-CM | POA: Diagnosis not present

## 2022-11-16 DIAGNOSIS — M21372 Foot drop, left foot: Secondary | ICD-10-CM

## 2022-11-16 MED ORDER — AMLODIPINE BESYLATE 5 MG PO TABS: 5.0000 mg | ORAL_TABLET | Freq: Every day | ORAL | 1 refills | Status: DC

## 2022-11-16 MED ORDER — GABAPENTIN 400 MG PO CAPS: 400.0000 mg | ORAL_CAPSULE | Freq: Four times a day (QID) | ORAL | 2 refills | Status: DC

## 2022-11-16 MED ORDER — DULOXETINE HCL 30 MG PO CPEP: 30.0000 mg | ORAL_CAPSULE | Freq: Every day | ORAL | 3 refills | Status: DC

## 2022-11-16 MED ORDER — PRAVASTATIN SODIUM 80 MG PO TABS: 80.0000 mg | ORAL_TABLET | Freq: Every day | ORAL | 3 refills | Status: DC

## 2022-11-16 NOTE — Progress Notes (Signed)
Established Patient Office Visit  Subjective:  Patient ID: Gregory Payne, male    DOB: August 12, 1961  Age: 62 y.o. MRN: [id number]  CC:  Chief Complaint  Patient presents with   Follow-up    Pt here for follow up, pt needs a referral to Dr. Hilda Lias, also needs to have forms completed for in home care. Would like a refill on gabapentin, and discuss going up a on dose.     HPI Gregory Payne is a 62 y.o. male with past medical history of hypertension, hyperlipidemia, depression, and chronic leg pain presents for f/u of  chronic medical conditions. For the details of today's visit, please refer to the assessment and plan.     Past Medical History:  Diagnosis Date   Anxiety    Chronic pain    Depression    Diabetes mellitus    Fibromyalgia    GERD (gastroesophageal reflux disease)    Hypercholesteremia    Hypertension    Kidney stones    Schizophrenia [facility])     Past Surgical History:  Procedure Laterality Date   CERVICAL DISC SURGERY     ESOPHAGOGASTRODUODENOSCOPY  01/28/2012   Procedure: ESOPHAGOGASTRODUODENOSCOPY (EGD);  Surgeon: Malissa Hippo, MD;  [facility];  Service: Endoscopy;  Laterality: N/A;  1200   HERNIA REPAIR     left inguinal hernia   LITHOTRIPSY      Family History  Problem Relation Age of Onset   Diabetes Mother    Kidney failure Mother    Hyperlipidemia Father    Alcoholism Father    Arthritis Daughter    Colon cancer Neg Hx    Prostate cancer Neg Hx     Social History   Socioeconomic History   Marital status: Single    Spouse name: Not on file   Number of children: 1   Years of education: 8th   Highest education level: Not on file  Occupational History   Not on file  Tobacco Use   Smoking status: Every Day    Packs/day: 0.50    Years: 35.00    Total pack years: 17.50    Types: Cigarettes   Smokeless tobacco: Not on file   Tobacco comments:    He has been smoking since 62 years old. He is not ready to quit  smoking.  Vaping Use   Vaping Use: Every day   Devices: he started vaping this year after his former PCP left.  Substance and Sexual Activity   Alcohol use: Yes    Comment: one 24 ounce can of beer once a week.   Drug use: Yes    Types: Marijuana    Comment: he has been using marijuana since he was 17, he has used cocaine in the past.   Sexual activity: Yes  Other Topics Concern   Not on file  Social History Narrative   Pt lives by himself, he has not been around people  because of his mental illness.    Social Determinants of Health   Financial Resource Strain: Low Risk  (11/12/2021)   Overall Financial Resource Strain (CARDIA)    Difficulty of Paying Living Expenses: Not hard at all  Food Insecurity: No Food Insecurity (11/12/2021)   Hunger Vital Sign    Worried About Running Out of Food in the Last Year: Never true    Ran Out of Food in the Last Year: Never true  Transportation Needs: No Transportation Needs (11/12/2021)   PRAPARE - Transportation  Lack of Transportation (Medical): No    Lack of Transportation (Non-Medical): No  Physical Activity: Insufficiently Active (11/06/2021)   Exercise Vital Sign    Days of Exercise per Week: 1 day    Minutes of Exercise per Session: 10 min  Stress: Stress Concern Present (11/06/2021)   [place]    Feeling of Stress : To some extent  Social Connections: Socially Isolated (11/12/2021)   Social Connection and Isolation Panel [NHANES]    Frequency of Communication with Friends and Family: Twice a week    Frequency of Social Gatherings with Friends and Family: Once a week    Attends Religious Services: Never    Marine scientist or Organizations: No    Attends Archivist Meetings: Never    Marital Status: Divorced  Human resources officer Violence: Not At Risk (11/12/2021)   Humiliation, Afraid, Rape, and Kick questionnaire    Fear of Current or Ex-Partner: No     Emotionally Abused: No    Physically Abused: No    Sexually Abused: No    Outpatient Medications Prior to Visit  Medication Sig Dispense Refill   cyanocobalamin 1000 MCG tablet Take 1,000 mcg by mouth daily.     docusate sodium (COLACE) 100 MG capsule Take 1 capsule (100 mg total) by mouth daily as needed for mild constipation. Reported on 01/14/2016 30 capsule 1   gabapentin (NEURONTIN) 300 MG capsule TAKE 1 CAPSULE BY MOUTH FOUR TIMES A DAY. 120 capsule 0   meloxicam (MOBIC) 15 MG tablet Take 15 mg by mouth daily.     OLANZapine (ZYPREXA) 5 MG tablet Take (1) tablet by mouth twice daily 60 tablet 0   omeprazole (PRILOSEC) 20 MG capsule TAKE 1 CAPSULE BY MOUTH ONCE DAILY FOR STOMACH. 90 capsule 0   TRINTELLIX 5 MG TABS tablet TAKE (1) TABLET BY MOUTH DAILY 90 tablet 0   VITAMIN D, CHOLECALCIFEROL, PO Take by mouth.     amLODipine (NORVASC) 5 MG tablet TAKE 1 TABLET BY MOUTH ONCE A DAY. 90 tablet 0   DULoxetine (CYMBALTA) 30 MG capsule TAKE ONE CAPSULE BY MOUTH ONCE DAILY. 30 capsule 0   gabapentin (NEURONTIN) 400 MG capsule Take 1 capsule (400 mg total) by mouth 4 (four) times daily. 120 capsule 2   pravastatin (PRAVACHOL) 80 MG tablet Take 1 tablet (80 mg total) by mouth daily. 90 tablet 3   No facility-administered medications prior to visit.    Allergies  Allergen Reactions   Naproxen     REACTION: GI bleed from internal Hemmorhoids    ROS Review of Systems  Constitutional:  Negative for fatigue and fever.  Eyes:  Negative for visual disturbance.  Respiratory:  Negative for chest tightness and shortness of breath.   Cardiovascular:  Negative for chest pain and palpitations.  Neurological:  Negative for dizziness and headaches.      Objective:    Physical Exam HENT:     Head: Normocephalic.     Right Ear: External ear normal.     Left Ear: External ear normal.     Nose: No congestion or rhinorrhea.     Mouth/Throat:     Mouth: Mucous membranes are moist.   Cardiovascular:     Rate and Rhythm: Regular rhythm.     Heart sounds: No murmur heard. Pulmonary:     Effort: No respiratory distress.     Breath sounds: Normal breath sounds.  Neurological:     Mental  Status: He is alert.     BP 124/82   Pulse 79   Ht 6\' 3"  (1.905 m)   Wt 187 lb 1.9 oz (84.9 kg)   SpO2 97%   BMI 23.39 kg/m  Wt Readings from Last 3 Encounters:  11/16/22 187 lb 1.9 oz (84.9 kg)  07/13/22 167 lb (75.8 kg)  03/11/22 168 lb (76.2 kg)    No results found for: "TSH" Lab Results  Component Value Date   WBC 9.6 11/03/2021   HGB 13.5 11/03/2021   HCT 42.3 11/03/2021   MCV 84 11/03/2021   PLT 255 11/03/2021   Lab Results  Component Value Date   NA 150 (H) 03/11/2022   K 4.5 03/11/2022   CO2 23 03/11/2022   GLUCOSE 98 03/11/2022   BUN 19 03/11/2022   CREATININE 1.00 03/11/2022   BILITOT 0.6 03/11/2022   ALKPHOS 87 03/11/2022   AST 19 03/11/2022   ALT 16 03/11/2022   PROT 7.4 03/11/2022   ALBUMIN 4.5 03/11/2022   CALCIUM 9.6 03/11/2022   EGFR 86 03/11/2022   Lab Results  Component Value Date   CHOL 181 03/11/2022   Lab Results  Component Value Date   HDL 48 03/11/2022   Lab Results  Component Value Date   LDLCALC 123 (H) 03/11/2022   Lab Results  Component Value Date   TRIG 52 03/11/2022   Lab Results  Component Value Date   CHOLHDL 3.8 03/11/2022   Lab Results  Component Value Date   HGBA1C 5.9 (H) 03/11/2022      Assessment & Plan:  Hypertension, unspecified type Assessment & Plan: Controlled He takes amlodipine 5 mg daily He denies chest pain, palpitation, shortness of breath, headaches, and dizziness He reports compliance with treatment regimen.  BP Readings from Last 3 Encounters:  11/16/22 124/82  07/13/22 128/72  03/11/22 124/76     Orders: -     amLODIPine Besylate; Take 1 tablet (5 mg total) by mouth daily.  Dispense: 90 tablet; Refill: 1  Essential hypertension -     CMP14+EGFR -     CBC with  Differential/Platelet  Depression, recurrent (HCC) -     DULoxetine HCl; Take 1 capsule (30 mg total) by mouth daily.  Dispense: 30 capsule; Refill: 3  Other hyperlipidemia Assessment & Plan: He takes pravastatin 80 mg daily Will assess lipid panel today Lab Results  Component Value Date   CHOL 181 03/11/2022   HDL 48 03/11/2022   LDLCALC 123 (H) 03/11/2022   TRIG 52 03/11/2022   CHOLHDL 3.8 03/11/2022     Orders: -     Lipid panel  Foot drop, left Assessment & Plan: Patient never followed up on the referral placed to orthopedics He reports misplacing his phone Will provide the contact number to orthopedics to schedule an appointment Refill gabapentin 400 mg to take 4 times daily as needed for chronic leg pain  Orders: -     Gabapentin; Take 1 capsule (400 mg total) by mouth 4 (four) times daily.  Dispense: 120 capsule; Refill: 2  IFG (impaired fasting glucose) -     Hemoglobin A1c  Colon cancer screening -     Ambulatory referral to Gastroenterology  Vitamin D deficiency -     VITAMIN D 25 Hydroxy (Vit-D Deficiency, Fractures)  Other specified hypothyroidism -     TSH + free T4  Diabetes mellitus without complication (HCC) -     Pravastatin Sodium; Take 1 tablet (80 mg total) by mouth  daily.  Dispense: 90 tablet; Refill: 3    Follow-up: Return in about 3 months (around 02/14/2023).   Alvira Monday, FNP

## 2022-11-16 NOTE — Assessment & Plan Note (Signed)
Denies suicidal ideation and thoughts He takes Cymbalta 30 mg daily

## 2022-11-16 NOTE — Assessment & Plan Note (Signed)
Controlled He takes amlodipine 5 mg daily He denies chest pain, palpitation, shortness of breath, headaches, and dizziness He reports compliance with treatment regimen.  BP Readings from Last 3 Encounters:  11/16/22 124/82  07/13/22 128/72  03/11/22 124/76

## 2022-11-16 NOTE — Assessment & Plan Note (Signed)
Patient never followed up on the referral placed to orthopedics He reports misplacing his phone Will provide the contact number to orthopedics to schedule an appointment Refill gabapentin 400 mg to take 4 times daily as needed for chronic leg pain

## 2022-11-16 NOTE — Assessment & Plan Note (Signed)
He takes pravastatin 80 mg daily Will assess lipid panel today Lab Results  Component Value Date   CHOL 181 03/11/2022   HDL 48 03/11/2022   LDLCALC 123 (H) 03/11/2022   TRIG 52 03/11/2022   CHOLHDL 3.8 03/11/2022

## 2022-11-16 NOTE — Patient Instructions (Addendum)
  I appreciate the opportunity to provide care to you today!    Follow up:  3 months  Labs: please stop by the lab today/ during the week  to get your blood drawn (CBC, CMP, TSH, Lipid profile, HgA1c, Vit D)  Please schedule Medicare Annual Wellness  Please follow-up with Ortho care [phone]   Please continue to a heart-healthy diet and increase your physical activities. Try to exercise for 110mins at least five times a week.     It was a pleasure to see you and I look forward to continuing to work together on your health and well-being. Please do not hesitate to call the office if you need care or have questions about your care.   Have a wonderful day and week. With Gratitude, Alvira Monday MSN, FNP-BC

## 2022-11-17 LAB — LIPID PANEL
Chol/HDL Ratio: 3.6 ratio (ref 0.0–5.0)
Cholesterol, Total: 171 mg/dL (ref 100–199)
HDL: 47 mg/dL (ref >39)
LDL Chol Calc (NIH): 109 mg/dL — ABNORMAL HIGH (ref 0–99)
Triglycerides: 79 mg/dL (ref 0–149)
VLDL Cholesterol Cal: 15 mg/dL (ref 5–40)

## 2022-11-17 LAB — CMP14+EGFR
ALT: 11 IU/L (ref 0–44)
AST: 16 IU/L (ref 0–40)
Albumin/Globulin Ratio: 1.3 (ref 1.2–2.2)
Albumin: 3.7 g/dL — ABNORMAL LOW (ref 3.9–4.9)
Alkaline Phosphatase: 111 IU/L (ref 44–121)
BUN/Creatinine Ratio: 12 (ref 10–24)
BUN: 12 mg/dL (ref 8–27)
Bilirubin Total: 0.3 mg/dL (ref 0.0–1.2)
CO2: 24 mmol/L (ref 20–29)
Calcium: 9 mg/dL (ref 8.6–10.2)
Chloride: 106 mmol/L (ref 96–106)
Creatinine, Ser: 1.02 mg/dL (ref 0.76–1.27)
Globulin, Total: 2.8 g/dL (ref 1.5–4.5)
Glucose: 96 mg/dL (ref 70–99)
Potassium: 4.4 mmol/L (ref 3.5–5.2)
Sodium: 142 mmol/L (ref 134–144)
Total Protein: 6.5 g/dL (ref 6.0–8.5)
eGFR: 83 mL/min/{1.73_m2} (ref >59)

## 2022-11-17 LAB — CBC WITH DIFFERENTIAL/PLATELET
Basophils Absolute: 0 10*3/uL (ref 0.0–0.2)
Basos: 1 %
EOS (ABSOLUTE): 0.1 10*3/uL (ref 0.0–0.4)
Eos: 1 %
Hematocrit: 39.7 % (ref 37.5–51.0)
Hemoglobin: 13.1 g/dL (ref 13.0–17.7)
Immature Grans (Abs): 0 10*3/uL (ref 0.0–0.1)
Immature Granulocytes: 0 %
Lymphocytes Absolute: 3.4 10*3/uL — ABNORMAL HIGH (ref 0.7–3.1)
Lymphs: 45 %
MCH: 27.3 pg (ref 26.6–33.0)
MCHC: 33 g/dL (ref 31.5–35.7)
MCV: 83 fL (ref 79–97)
Monocytes Absolute: 0.6 10*3/uL (ref 0.1–0.9)
Monocytes: 8 %
Neutrophils Absolute: 3.5 10*3/uL (ref 1.4–7.0)
Neutrophils: 45 %
Platelets: 247 10*3/uL (ref 150–450)
RBC: 4.8 x10E6/uL (ref 4.14–5.80)
RDW: 12.7 % (ref 11.6–15.4)
WBC: 7.6 10*3/uL (ref 3.4–10.8)

## 2022-11-17 LAB — HEMOGLOBIN A1C
Est. average glucose Bld gHb Est-mCnc: 126 mg/dL
Hgb A1c MFr Bld: 6 % — ABNORMAL HIGH (ref 4.8–5.6)

## 2022-11-17 LAB — TSH+FREE T4
Free T4: 1.15 ng/dL (ref 0.82–1.77)
TSH: 2.11 u[IU]/mL (ref 0.450–4.500)

## 2022-11-17 LAB — VITAMIN D 25 HYDROXY (VIT D DEFICIENCY, FRACTURES): Vit D, 25-Hydroxy: 24.9 ng/mL — ABNORMAL LOW (ref 30.0–100.0)

## 2022-11-18 NOTE — Telephone Encounter (Signed)
Social worker pick up FL2 forms

## 2022-11-19 NOTE — Telephone Encounter (Signed)
Forms faxed

## 2022-11-22 NOTE — Progress Notes (Signed)
Your hemoglobin A1c has increased from 5.9 to 6.0.  Your labs indicate that you are prediabetic.  I recommend decreasing your intake of high sugar foods with increased physical activity. your vitamin D is slightly low, I recommend taking vitamin D supplement over-the-counter at 1000 IU daily. Your LDL cholesterol has decreased from 123 to 109. I recommend avoiding simple carbohydrates, including cakes, sweet desserts, ice cream, soda (diet or regular), sweet tea, candies, chips, cookies, store-bought juices, alcohol in excess of 1-2 drinks a day, lemonade, artificial sweeteners, donuts, coffee creamers, and sugar-free products.  I recommend avoiding greasy, fatty foods with increased physical activity.

## 2022-12-02 ENCOUNTER — Other Ambulatory Visit: Payer: Self-pay | Admitting: Family Medicine

## 2022-12-02 DIAGNOSIS — F200 Paranoid schizophrenia: Secondary | ICD-10-CM

## 2022-12-14 ENCOUNTER — Ambulatory Visit (INDEPENDENT_AMBULATORY_CARE_PROVIDER_SITE_OTHER): Payer: Medicare HMO | Admitting: Orthopaedic Surgery

## 2022-12-14 ENCOUNTER — Encounter: Payer: Self-pay | Admitting: Orthopaedic Surgery

## 2022-12-14 VITALS — BP 135/82 | HR 75 | Ht 75.0 in | Wt 187.0 lb

## 2022-12-14 DIAGNOSIS — M21372 Foot drop, left foot: Secondary | ICD-10-CM | POA: Diagnosis not present

## 2022-12-14 NOTE — Patient Instructions (Addendum)
 DR.KEELING'S SCHEDULE IS AS FOLLOWS: TUESDAY: ALL DAY WEDNESDAY: MORNING ONLY THURSDAY: MORNING ONLY PLEASE CALL OR SEND A MESSAGE VIA MYCHART BY WEDNESDAY MORNING SO THAT I CAN SEND A REQUEST TO HIM. HE LEAVES THURSDAY BY 11:30AM. HE DOES NOT RETURN TO THE OFFICE UNTIL TUESDAY MORNINGS AND HE DOES NOT CHECK HIS WORK MESSAGES DURING HIS TIME AWAY FROM THE OFFICE. I'M  AND IF YOU NEED SOMETHING, SEND ME A MYCHART MESSAGE OR CALL. MYCHART IS THE FASTEST WAY TO GET IN CONTACT WITH ME.   [place] Address: [address], [place] [address] Phone: [phone]  CUSTOMIZED AFO BRACE LT  FOLLOW UP: 3 WEEKS LT FOOT DROP

## 2022-12-14 NOTE — Progress Notes (Signed)
Subjective:    Patient ID: Gregory Payne, male    DOB: May 21, 1961, 62 y.o.   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  He is in need of a new brace for the left foot.  He has no new trauma.  I have told him about a new design brace and he is interested in that if his insurance will cover it.   Review of Systems  Constitutional:  Positive for activity change.  Musculoskeletal:  Positive for arthralgias, back pain and myalgias.  All other systems reviewed and are negative.  For Review of Systems, all other systems reviewed and are negative.  The following is a summary of the past history medically, past history surgically, known current medicines, social history and family history.  This information is gathered electronically by the computer from prior information and documentation.  I review this each visit and have found including this information at this point in the chart is beneficial and informative.   Past Medical History:  Diagnosis Date   Anxiety    Chronic pain    Depression    Diabetes mellitus    Fibromyalgia    GERD (gastroesophageal reflux disease)    Hypercholesteremia    Hypertension    Kidney stones    Schizophrenia [facility])     Past Surgical History:  Procedure Laterality Date   CERVICAL DISC SURGERY     ESOPHAGOGASTRODUODENOSCOPY  01/28/2012   Procedure: ESOPHAGOGASTRODUODENOSCOPY (EGD);  Surgeon: Rogene Houston, MD;  [facility];  Service: Endoscopy;  Laterality: N/A;  [place]     left inguinal hernia   LITHOTRIPSY      Current Outpatient Medications on File Prior to Visit  Medication Sig Dispense Refill   amLODipine (NORVASC) 5 MG tablet Take 1 tablet (5 mg total) by mouth daily. 90 tablet 1   cyanocobalamin 1000 MCG tablet Take 1,000 mcg by mouth daily.     docusate sodium (COLACE) 100 MG capsule Take 1 capsule (100 mg total) by mouth daily as needed for mild  constipation. Reported on 01/14/2016 30 capsule 1   DULoxetine (CYMBALTA) 30 MG capsule Take 1 capsule (30 mg total) by mouth daily. 30 capsule 3   gabapentin (NEURONTIN) 300 MG capsule TAKE 1 CAPSULE BY MOUTH FOUR TIMES A DAY. 120 capsule 0   gabapentin (NEURONTIN) 400 MG capsule Take 1 capsule (400 mg total) by mouth 4 (four) times daily. 120 capsule 2   meloxicam (MOBIC) 15 MG tablet Take 15 mg by mouth daily.     OLANZapine (ZYPREXA) 5 MG tablet TAKE (1) TABLET BY MOUTH TWICE DAILY. 60 tablet 0   omeprazole (PRILOSEC) 20 MG capsule TAKE 1 CAPSULE BY MOUTH ONCE DAILY FOR STOMACH. 90 capsule 0   pravastatin (PRAVACHOL) 80 MG tablet Take 1 tablet (80 mg total) by mouth daily. 90 tablet 3   TRINTELLIX 5 MG TABS tablet TAKE (1) TABLET BY MOUTH DAILY 90 tablet 0   VITAMIN D, CHOLECALCIFEROL, PO Take by mouth.     No current facility-administered medications on file prior to visit.    Social History   Socioeconomic History   Marital status: Single    Spouse name: Not on file   Number of children: 1   Years of education: 8th   Highest education level: Not on file  Occupational History   Not on file  Tobacco Use  Smoking status: Every Day    Packs/day: 0.50    Years: 35.00    Total pack years: 17.50    Types: Cigarettes   Smokeless tobacco: Not on file   Tobacco comments:    He has been smoking since 62 years old. He is not ready to quit smoking.  Vaping Use   Vaping Use: Every day   Devices: he started vaping this year after his former PCP left.  Substance and Sexual Activity   Alcohol use: Yes    Comment: one 24 ounce can of beer once a week.   Drug use: Yes    Types: Marijuana    Comment: he has been using marijuana since he was 75, he has used cocaine in the past.   Sexual activity: Yes  Other Topics Concern   Not on file  Social History Narrative   Pt lives by himself, he has not been around people  because of his mental illness.    Social Determinants of Health    Financial Resource Strain: Low Risk  (11/12/2021)   Overall Financial Resource Strain (CARDIA)    Difficulty of Paying Living Expenses: Not hard at all  Food Insecurity: No Food Insecurity (11/12/2021)   Hunger Vital Sign    Worried About Running Out of Food in the Last Year: Never true    Ran Out of Food in the Last Year: Never true  Transportation Needs: No Transportation Needs (11/12/2021)   PRAPARE - Hydrologist (Medical): No    Lack of Transportation (Non-Medical): No  Physical Activity: Insufficiently Active (11/06/2021)   Exercise Vital Sign    Days of Exercise per Week: 1 day    Minutes of Exercise per Session: 10 min  Stress: Stress Concern Present (11/06/2021)   [place]    Feeling of Stress : To some extent  Social Connections: Socially Isolated (11/12/2021)   Social Connection and Isolation Panel [NHANES]    Frequency of Communication with Friends and Family: Twice a week    Frequency of Social Gatherings with Friends and Family: Once a week    Attends Religious Services: Never    Marine scientist or Organizations: No    Attends Archivist Meetings: Never    Marital Status: Divorced  Human resources officer Violence: Not At Risk (11/12/2021)   Humiliation, Afraid, Rape, and Kick questionnaire    Fear of Current or Ex-Partner: No    Emotionally Abused: No    Physically Abused: No    Sexually Abused: No    Family History  Problem Relation Age of Onset   Diabetes Mother    Kidney failure Mother    Hyperlipidemia Father    Alcoholism Father    Arthritis Daughter    Colon cancer Neg Hx    Prostate cancer Neg Hx     BP 135/82   Pulse 75   Ht '6\' 3"'$  (1.905 m)   Wt 187 lb (84.8 kg)   BMI 23.37 kg/m   Body mass index is 23.37 kg/m.     Objective:   Physical Exam Vitals and nursing note reviewed. Exam conducted with a chaperone present.  Constitutional:       Appearance: He is well-developed.  HENT:     Head: Normocephalic and atraumatic.  Eyes:     Conjunctiva/sclera: Conjunctivae normal.     Pupils: Pupils are equal, round, and reactive to light.  Cardiovascular:  Rate and Rhythm: Normal rate and regular rhythm.  Pulmonary:     Effort: Pulmonary effort is normal.  Abdominal:     Palpations: Abdomen is soft.  Musculoskeletal:     Cervical back: Normal range of motion and neck supple.       Legs:  Skin:    General: Skin is warm and dry.  Neurological:     Mental Status: He is alert and oriented to person, place, and time.     Cranial Nerves: No cranial nerve deficit.     Motor: No abnormal muscle tone.     Coordination: Coordination normal.     Deep Tendon Reflexes: Reflexes are normal and symmetric. Reflexes normal.  Psychiatric:        Behavior: Behavior normal.        Thought Content: Thought content normal.        Judgment: Judgment normal.           Assessment & Plan:   Encounter Diagnosis  Name Primary?   Foot drop, left Yes   We will arrange prosthetic company to see him.  I will try to get carbon fiber brace if possible.  Return in three weeks.  Numbers to the company given.  Call if any problem.  Precautions discussed.  Electronically Signed Sanjuana Kava, MD 3/5/20243:44 PM

## 2022-12-30 ENCOUNTER — Telehealth: Payer: Self-pay | Admitting: Orthopaedic Surgery

## 2022-12-30 NOTE — Telephone Encounter (Signed)
I called Gregory Payne asked her to check with [facility] and see if they take his medicaid, left a copy of the prescription at front desk for pick up so she can have it since she said she does not have. She voiced understanding

## 2022-12-30 NOTE — Telephone Encounter (Signed)
Dr. Brooke Bonito patient - patient's sister Ileene Hutchinson [phone] presented to the office stated that the place we advised the patient to go to to get a brace told the patient that they do not accept [place] Medicare or [place] Medicaid.  She stated the patient needs to be referred to someone in network.  The patient has an appointment scheduled for 01/04/23 and she wants to confirm if he still needs to keep this appointment or not.  She said someone mention [facility] in [place].

## 2022-12-31 ENCOUNTER — Telehealth: Payer: Self-pay | Admitting: Orthopaedic Surgery

## 2022-12-31 NOTE — Telephone Encounter (Signed)
Dr. Brooke Bonito patient - the patient's sister called and stated that before the patient can get an appointment, the script needs to be sent.  She would like for it to be sent wherever the patient is being sent for his brace.

## 2022-12-31 NOTE — Telephone Encounter (Signed)
I refaxed the prescription to [facility].  I called to confirm it was received.  I spoke to [facility], she confirmed it was received, but it was not legible.  The ink is so light they can't read it.  Per her request I emailed a screenshot of the prescription to her, which was easier to read.  She also requested demographics, the office note supporting this prescription and his insurance card.  She stated anytime we send a prescription they need all this information to accompany the script.

## 2022-12-31 NOTE — Telephone Encounter (Signed)
Faxed

## 2022-12-31 NOTE — Telephone Encounter (Signed)
Dr. Brooke Bonito patient - Pt's sister Ileene Hutchinson lvm stating that she had gotten a call that a script was faxed over to [facility] and they are saying that did not receive it.  She would like that sent.

## 2023-01-03 ENCOUNTER — Other Ambulatory Visit: Payer: Self-pay | Admitting: Family Medicine

## 2023-01-04 ENCOUNTER — Ambulatory Visit: Payer: Medicare HMO | Admitting: Orthopaedic Surgery

## 2023-01-05 ENCOUNTER — Ambulatory Visit (INDEPENDENT_AMBULATORY_CARE_PROVIDER_SITE_OTHER): Payer: Medicare HMO | Admitting: Internal Medicine

## 2023-01-05 ENCOUNTER — Encounter: Payer: Self-pay | Admitting: Internal Medicine

## 2023-01-05 DIAGNOSIS — J309 Allergic rhinitis, unspecified: Secondary | ICD-10-CM

## 2023-01-05 DIAGNOSIS — Z0000 Encounter for general adult medical examination without abnormal findings: Secondary | ICD-10-CM

## 2023-01-05 MED ORDER — FLUTICASONE PROPIONATE 50 MCG/ACT NA SUSP: 2.0000 | Freq: Every day | NASAL | 1 refills | Status: DC | PRN

## 2023-01-05 MED ORDER — CETIRIZINE HCL 10 MG PO TABS: 10.0000 mg | ORAL_TABLET | Freq: Every day | ORAL | 1 refills | Status: AC

## 2023-01-05 NOTE — Patient Instructions (Signed)
  Gregory Payne , Thank you for taking time to come for your Medicare Wellness Visit. I appreciate your ongoing commitment to your health goals. Please review the following plan we discussed and let me know if I can assist you in the future.   These are the goals we discussed: No specific goal.   This is a list of the screening recommended for you and due dates:  Health Maintenance  Topic Date Due   Hepatitis C Screening: USPSTF Recommendation to screen - Ages 56-79 yo.  Never done   DTaP/Tdap/Td vaccine (1 - Tdap) Never done   Colon Cancer Screening  Never done   Medicare Annual Wellness Visit  11/12/2022   Eye exam for diabetics  12/08/2022   Flu Shot  01/09/2023*   Zoster (Shingles) Vaccine (1 of 2) 02/14/2023*   Yearly kidney health urinalysis for diabetes  03/12/2023   Hemoglobin A1C  05/17/2023   Complete foot exam   07/14/2023   Yearly kidney function blood test for diabetes  11/17/2023   HIV Screening  Completed   HPV Vaccine  Aged Out   COVID-19 Vaccine  Discontinued  *Topic was postponed. The date shown is not the original due date.

## 2023-01-05 NOTE — Progress Notes (Signed)
Subjective:  This is a telephone encounter between Gregory Payne and Gregory Payne on 01/05/2023 for AWV. The visit was conducted with the patient located at home and Gregory Payne at [facility]. The patient's identity was confirmed using their DOB and current address. The patient has consented to being evaluated through a telephone encounter and understands the associated risks (an examination cannot be done and the patient may need to come in for an appointment) / benefits (allows the patient to remain at home, decreasing exposure to coronavirus).     Gregory Payne is a 62 y.o. male who presents for Medicare Annual/Subsequent preventive examination.  Review of Systems    Review of Systems  All other systems reviewed and are negative.  Objective:    Today's Vitals   01/05/23 1604  PainSc: 5    There is no height or weight on file to calculate BMI.     01/05/2023    4:07 PM 11/12/2021   11:15 AM 10/07/2012    6:50 PM 01/28/2012   12:09 PM  Advanced Directives  Does Patient Have a Medical Advance Directive? No No Patient does not have advance directive;Patient would not like information Patient does not have advance directive;Patient would not like information  Would patient like information on creating a medical advance directive? Yes (MAU/Ambulatory/Procedural Areas - Information given) No - Patient declined    Pre-existing out of facility DNR order (yellow form or pink MOST form)    No    Current Medications (verified) Outpatient Encounter Medications as of 01/05/2023  Medication Sig   cetirizine (ZYRTEC ALLERGY) 10 MG tablet Take 1 tablet (10 mg total) by mouth daily.   fluticasone (FLONASE) 50 MCG/ACT nasal spray Place 2 sprays into both nostrils daily as needed for allergies or rhinitis.   amLODipine (NORVASC) 5 MG tablet Take 1 tablet (5 mg total) by mouth daily.   cyanocobalamin 1000 MCG tablet Take 1,000 mcg by mouth daily.   docusate sodium (COLACE) 100 MG capsule Take 1  capsule (100 mg total) by mouth daily as needed for mild constipation. Reported on 01/14/2016   DULoxetine (CYMBALTA) 30 MG capsule Take 1 capsule (30 mg total) by mouth daily.   gabapentin (NEURONTIN) 300 MG capsule TAKE 1 CAPSULE BY MOUTH FOUR TIMES A DAY.   gabapentin (NEURONTIN) 400 MG capsule Take 1 capsule (400 mg total) by mouth 4 (four) times daily.   meloxicam (MOBIC) 15 MG tablet Take 15 mg by mouth daily.   OLANZapine (ZYPREXA) 5 MG tablet TAKE (1) TABLET BY MOUTH TWICE DAILY.   omeprazole (PRILOSEC) 20 MG capsule TAKE 1 CAPSULE BY MOUTH ONCE DAILY FOR STOMACH.   pravastatin (PRAVACHOL) 80 MG tablet Take 1 tablet (80 mg total) by mouth daily.   TRINTELLIX 5 MG TABS tablet TAKE (1) TABLET BY MOUTH DAILY   VITAMIN D, CHOLECALCIFEROL, PO Take by mouth.   No facility-administered encounter medications on file as of 01/05/2023.    Allergies (verified) Naproxen   History: Past Medical History:  Diagnosis Date   Anxiety    Chronic pain    Depression    Diabetes mellitus    Fibromyalgia    GERD (gastroesophageal reflux disease)    Hypercholesteremia    Hypertension    Kidney stones    Schizophrenia [facility])    Past Surgical History:  Procedure Laterality Date   CERVICAL DISC SURGERY     ESOPHAGOGASTRODUODENOSCOPY  01/28/2012   Procedure: ESOPHAGOGASTRODUODENOSCOPY (EGD);  Surgeon: Rogene Houston, MD;  [facility]  [facility];  Service: Endoscopy;  Laterality: N/A;  [place]     left inguinal hernia   LITHOTRIPSY     Family History  Problem Relation Age of Onset   Diabetes Mother    Kidney failure Mother    Hyperlipidemia Father    Alcoholism Father    Arthritis Daughter    Colon cancer Neg Hx    Prostate cancer Neg Hx    Social History   Socioeconomic History   Marital status: Single    Spouse name: Not on file   Number of children: 1   Years of education: 8th   Highest education level: Not on file  Occupational History   Not on file  Tobacco Use    Smoking status: Every Day    Packs/day: 0.50    Years: 35.00    Additional pack years: 0.00    Total pack years: 17.50    Types: Cigarettes   Smokeless tobacco: Not on file   Tobacco comments:    He has been smoking since 62 years old. He is not ready to quit smoking.  Vaping Use   Vaping Use: Every day   Devices: he started vaping this year after his former PCP left.  Substance and Sexual Activity   Alcohol use: Yes    Comment: one 24 ounce can of beer once a week.   Drug use: Yes    Types: Marijuana    Comment: he has been using marijuana since he was 37, he has used cocaine in the past.   Sexual activity: Yes  Other Topics Concern   Not on file  Social History Narrative   Pt lives by himself, he has not been around people  because of his mental illness.    Social Determinants of Health   Financial Resource Strain: Low Risk  (11/12/2021)   Overall Financial Resource Strain (CARDIA)    Difficulty of Paying Living Expenses: Not hard at all  Food Insecurity: No Food Insecurity (11/12/2021)   Hunger Vital Sign    Worried About Running Out of Food in the Last Year: Never true    Ran Out of Food in the Last Year: Never true  Transportation Needs: No Transportation Needs (11/12/2021)   PRAPARE - Hydrologist (Medical): No    Lack of Transportation (Non-Medical): No  Physical Activity: Insufficiently Active (11/06/2021)   Exercise Vital Sign    Days of Exercise per Week: 1 day    Minutes of Exercise per Session: 10 min  Stress: Stress Concern Present (11/06/2021)   [place]    Feeling of Stress : To some extent  Social Connections: Socially Isolated (11/12/2021)   Social Connection and Isolation Panel [NHANES]    Frequency of Communication with Friends and Family: Twice a week    Frequency of Social Gatherings with Friends and Family: Once a week    Attends Religious Services: Never     Marine scientist or Organizations: No    Attends Music therapist: Never    Marital Status: Divorced    Tobacco Counseling Ready to quit: Not Answered Counseling given: Not Answered Tobacco comments: He has been smoking since 61 years old. He is not ready to quit smoking.   Clinical Intake:  Pre-visit preparation completed: Yes  Pain : No/denies pain Pain Score: 5      Diabetes: No  How often do you  need to have someone help you when you read instructions, pamphlets, or other written materials from your doctor or pharmacy?: 5 - Always What is the last grade level you completed in school?: 8th grade  Diabetic?  Interpreter Needed?: No    Activities of Daily Living    01/05/2023    4:09 PM  In your present state of health, do you have any difficulty performing the following activities:  Hearing? 0  Vision? 0  Difficulty concentrating or making decisions? 0  Walking or climbing stairs? 0  Dressing or bathing? 0  Doing errands, shopping? 0    Patient Care Team: Alvira Monday, [place] as PCP - General (Family Medicine) Beryle Lathe, [facility] (Inactive) (Pharmacist) Shea Evans Norva Riffle, LCSW as [employer] (Licensed Clinical Social Worker)  Indicate any recent [place] you may have received from other than [facility] providers in the past year (date may be approximate).     Assessment:   This is a routine wellness examination for [place].  Hearing/Vision screen No results found.  Dietary issues and exercise activities discussed:     Goals Addressed   None    Depression Screen    01/05/2023    4:08 PM 07/13/2022    2:33 PM 03/11/2022    1:34 PM 11/12/2021   11:06 AM 11/06/2021    3:54 PM 10/22/2021    9:05 AM 09/28/2021    3:15 PM  PHQ 2/9 Scores  PHQ - 2 Score 0 0 0 2 2 0   PHQ- 9 Score  0  10 10    Exception Documentation       Medical reason    Fall Risk    01/05/2023    4:08 PM 07/13/2022    2:32  PM 03/11/2022    1:33 PM 11/12/2021   11:15 AM 09/28/2021    3:01 PM  [place] in the past year? 0 1 1 1 1   Number falls in past yr:  1 1 1 1   Injury with Fall?  1 1 0 1  Risk for fall due to :   History of fall(s) History of fall(s);Orthopedic patient Impaired balance/gait;Impaired mobility  Follow up   Falls evaluation completed Falls evaluation completed Falls evaluation completed;Education provided;Falls prevention discussed    FALL RISK PREVENTION PERTAINING TO THE HOME:  Any stairs in or around the home? No  If so, are there any without handrails? No  Home free of loose throw rugs in walkways, pet beds, electrical cords, etc? Yes  Adequate lighting in your home to reduce risk of falls? Yes   ASSISTIVE DEVICES UTILIZED TO PREVENT FALLS:  Life alert? No  Use of a cane, walker or w/c? Yes  Grab bars in the bathroom? Yes  Shower chair or bench in shower? No  Elevated toilet seat or a handicapped toilet? Yes    Cognitive Function:        01/05/2023    4:09 PM 11/12/2021   11:20 AM  6CIT Screen  What Year? 0 points 0 points  What month? 0 points 0 points  What time? 0 points 0 points  Count back from 20 0 points 0 points  Months in reverse 4 points 4 points  Repeat phrase 6 points 2 points  Total Score 10 points 6 points    Immunizations There is no immunization history for the selected administration types on file for this patient.  TDAP status: Due, Education has been provided regarding the  importance of this vaccine. Advised may receive this vaccine at local pharmacy or Health Dept. Aware to provide a copy of the vaccination record if obtained from local pharmacy or Health Dept. Verbalized acceptance and understanding.  Flu Vaccine status: Declined, Education has been provided regarding the importance of this vaccine but patient still declined. Advised may receive this vaccine at local pharmacy or Health Dept. Aware to provide a copy of the vaccination record if  obtained from local pharmacy or Health Dept. Verbalized acceptance and understanding.  Pneumococcal vaccine status: Declined,  Education has been provided regarding the importance of this vaccine but patient still declined. Advised may receive this vaccine at local pharmacy or Health Dept. Aware to provide a copy of the vaccination record if obtained from local pharmacy or Health Dept. Verbalized acceptance and understanding.   Covid-19 vaccine status: Declined, Education has been provided regarding the importance of this vaccine but patient still declined. Advised may receive this vaccine at local pharmacy or Health Dept.or vaccine clinic. Aware to provide a copy of the vaccination record if obtained from local pharmacy or Health Dept. Verbalized acceptance and understanding.  Qualifies for Shingles Vaccine? Yes   Zostavax completed No   Shingrix Completed?: No.    Education has been provided regarding the importance of this vaccine. Patient has been advised to call insurance company to determine out of pocket expense if they have not yet received this vaccine. Advised may also receive vaccine at local pharmacy or Health Dept. Verbalized acceptance and understanding.  Screening Tests Health Maintenance  Topic Date Due   Hepatitis C Screening  Never done   DTaP/Tdap/Td (1 - Tdap) Never done   COLONOSCOPY (Pts 45-71yrs Insurance coverage will need to be confirmed)  Never done   Medicare Annual Wellness (AWV)  11/12/2022   OPHTHALMOLOGY EXAM  12/08/2022   INFLUENZA VACCINE  01/09/2023 (Originally 05/11/2022)   Zoster Vaccines- Shingrix (1 of 2) 02/14/2023 (Originally 08/02/2010)   Diabetic kidney evaluation - Urine ACR  03/12/2023   HEMOGLOBIN A1C  05/17/2023   FOOT EXAM  07/14/2023   Diabetic kidney evaluation - eGFR measurement  11/17/2023   HIV Screening  Completed   HPV VACCINES  Aged Out   COVID-19 Vaccine  Discontinued    Health Maintenance  Health Maintenance Due  Topic Date Due    Hepatitis C Screening  Never done   DTaP/Tdap/Td (1 - Tdap) Never done   COLONOSCOPY (Pts 45-13yrs Insurance coverage will need to be confirmed)  Never done   [facility] Wellness (AWV)  11/12/2022   OPHTHALMOLOGY EXAM  12/08/2022    Colorectal cancer screening: Type of screening: Cologuard. Has test, needs to complete  Lung Cancer Screening: (Low Dose CT Chest recommended if Age 61-80 years, 30 pack-year currently smoking OR have quit w/in 15years.) does qualify. He will defer at this time.   Additional Screening:  Hepatitis C Screening: does qualify; Can be done with blood work at next visit.  Vision Screening: Recommended annual ophthalmology exams for early detection of glaucoma and other disorders of the eye. Is the patient up to date with their annual eye exam?  No  Who is the provider or what is the name of the office in which the patient attends annual eye exams? [facility] If pt is not established with a provider, would they like to be referred to a provider to establish care? No .   Dental Screening: Recommended annual dental exams for proper oral hygiene  Community Resource Referral / Chronic  Care Management: CRR required this visit?  No   CCM required this visit?  No      Plan:     I have personally reviewed and noted the following in the patient's chart:   Medical and social history Use of alcohol, tobacco or illicit drugs  Current medications and supplements including opioid prescriptions. Patient is not currently taking opioid prescriptions. Functional ability and status Nutritional status Physical activity Advanced directives List of other physicians Hospitalizations, surgeries, and ER visits in previous 12 months Vitals Screenings to include cognitive, depression, and falls Referrals and appointments  In addition, I have reviewed and discussed with patient certain preventive protocols, quality metrics, and best practice recommendations. A written  personalized care plan for preventive services as well as general preventive health recommendations were provided to patient.     Gregory Dy, MD   01/05/2023

## 2023-02-01 ENCOUNTER — Ambulatory Visit: Payer: Medicare HMO | Admitting: Orthopaedic Surgery

## 2023-02-02 ENCOUNTER — Telehealth: Payer: Self-pay | Admitting: Orthopaedic Surgery

## 2023-02-02 ENCOUNTER — Other Ambulatory Visit: Payer: Self-pay | Admitting: Family Medicine

## 2023-02-02 DIAGNOSIS — F200 Paranoid schizophrenia: Secondary | ICD-10-CM

## 2023-02-02 NOTE — Telephone Encounter (Signed)
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  She was calling to confirm the pt's appt, which he missed yesterday.  She stated that he has an appointment to get his brace w/[facility] on 02/22/23, she wants to know if he needs to reschedule for now or after he gets that brace.

## 2023-02-03 NOTE — Telephone Encounter (Signed)
Returned call. No answer. Left message letting sister know to schedule appt for after 5/14 when patient is supposed to get his brace from [facility].

## 2023-02-15 ENCOUNTER — Ambulatory Visit: Payer: Medicare HMO | Admitting: Family Medicine

## 2023-02-22 DIAGNOSIS — M21372 Foot drop, left foot: Secondary | ICD-10-CM | POA: Diagnosis not present

## 2023-03-01 ENCOUNTER — Ambulatory Visit (INDEPENDENT_AMBULATORY_CARE_PROVIDER_SITE_OTHER): Payer: Medicare HMO | Admitting: Orthopaedic Surgery

## 2023-03-01 ENCOUNTER — Encounter: Payer: Self-pay | Admitting: Orthopaedic Surgery

## 2023-03-01 VITALS — BP 145/93 | HR 76 | Ht 75.0 in | Wt 187.0 lb

## 2023-03-01 DIAGNOSIS — M21372 Foot drop, left foot: Secondary | ICD-10-CM | POA: Diagnosis not present

## 2023-03-01 DIAGNOSIS — F17210 Nicotine dependence, cigarettes, uncomplicated: Secondary | ICD-10-CM | POA: Diagnosis not present

## 2023-03-01 DIAGNOSIS — M25562 Pain in left knee: Secondary | ICD-10-CM

## 2023-03-01 DIAGNOSIS — G8929 Other chronic pain: Secondary | ICD-10-CM

## 2023-03-01 NOTE — Progress Notes (Signed)
My new brace is great.  He really likes his new AFO.  It is lighter, easier to use and does not hurt him.  He is very pleased.    He has left knee pain, and needs a new brace.  I will give him one.  Brace fits well, AFO.  He has foot drop left.  His left knee has effusion, crepitus, ROM 0 to 105, old healed scars, limp left, uses a cane, no wound problem.  Encounter Diagnoses  Name Primary?   Foot drop, left Yes   Chronic pain of left knee    Nicotine dependence, cigarettes, uncomplicated     Return prn.  Call if any problem.  Precautions discussed.  Electronically Signed Darreld Mclean, MD 5/21/20242:46 PM

## 2023-03-01 NOTE — Patient Instructions (Signed)
Steps to Quit Smoking Smoking tobacco is the leading cause of preventable death. It can affect almost every organ in the body. Smoking puts you and people around you at risk for many serious, long-lasting (chronic) diseases. Quitting smoking can be hard, but it is one of the best things that you can do for your health. It is never too late to quit. Do not give up if you cannot quit the first time. Some people need to try many times to quit. Do your best to stick to your quit plan, and talk with your doctor if you have any questions or concerns. How do I get ready to quit? Pick a date to quit. Set a date within the next 2 weeks to give you time to prepare. Write down the reasons why you are quitting. Keep this list in places where you will see it often. Tell your family, friends, and co-workers that you are quitting. Their support is important. Talk with your doctor about the choices that may help you quit. Find out if your health insurance will pay for these treatments. Know the people, places, things, and activities that make you want to smoke (triggers). Avoid them. What first steps can I take to quit smoking? Throw away all cigarettes at home, at work, and in your car. Throw away the things that you use when you smoke, such as ashtrays and lighters. Clean your car. Empty the ashtray. Clean your home, including curtains and carpets. What can I do to help me quit smoking? Talk with your doctor about taking medicines and seeing a counselor. You are more likely to succeed when you do both. If you are pregnant or breastfeeding: Talk with your doctor about counseling or other ways to quit smoking. Do not take medicine to help you quit smoking unless your doctor tells you to. Quit right away Quit smoking completely, instead of slowly cutting back on how much you smoke over a period of time. Stopping smoking right away may be more successful than slowly quitting. Go to counseling. In-person is best  if this is an option. You are more likely to quit if you go to counseling sessions regularly. Take medicine You may take medicines to help you quit. Some medicines need a prescription, and some you can buy over-the-counter. Some medicines may contain a drug called nicotine to replace the nicotine in cigarettes. Medicines may: Help you stop having the desire to smoke (cravings). Help to stop the problems that come when you stop smoking (withdrawal symptoms). Your doctor may ask you to use: Nicotine patches, gum, or lozenges. Nicotine inhalers or sprays. Non-nicotine medicine that you take by mouth. Find resources Find resources and other ways to help you quit smoking and remain smoke-free after you quit. They include: Online chats with a counselor. Phone quitlines. Printed self-help materials. Support groups or group counseling. Text messaging programs. Mobile phone apps. Use apps on your mobile phone or tablet that can help you stick to your quit plan. Examples of free services include Quit Guide from the CDC and smokefree.gov  What can I do to make it easier to quit?  Talk to your family and friends. Ask them to support and encourage you. Call a phone quitline, such as 1-800-QUIT-NOW, reach out to support groups, or work with a counselor. Ask people who smoke to not smoke around you. Avoid places that make you want to smoke, such as: Bars. Parties. Smoke-break areas at work. Spend time with people who do not smoke. Lower   the stress in your life. Stress can make you want to smoke. Try these things to lower stress: Getting regular exercise. Doing deep-breathing exercises. Doing yoga. Meditating. What benefits will I see if I quit smoking? Over time, you may have: A better sense of smell and taste. Less coughing and sore throat. A slower heart rate. Lower blood pressure. Clearer skin. Better breathing. Fewer sick days. Summary Quitting smoking can be hard, but it is one of  the best things that you can do for your health. Do not give up if you cannot quit the first time. Some people need to try many times to quit. When you decide to quit smoking, make a plan to help you succeed. Quit smoking right away, not slowly over a period of time. When you start quitting, get help and support to keep you smoke-free. This information is not intended to replace advice given to you by your health care provider. Make sure you discuss any questions you have with your health care provider. Document Revised: 09/18/2021 Document Reviewed: 09/18/2021 Elsevier Patient Education  2023 Elsevier Inc.  

## 2023-03-04 ENCOUNTER — Other Ambulatory Visit: Payer: Self-pay | Admitting: Family Medicine

## 2023-03-04 DIAGNOSIS — F200 Paranoid schizophrenia: Secondary | ICD-10-CM

## 2023-03-04 DIAGNOSIS — M21372 Foot drop, left foot: Secondary | ICD-10-CM

## 2023-04-05 ENCOUNTER — Other Ambulatory Visit: Payer: Self-pay | Admitting: Family Medicine

## 2023-04-05 DIAGNOSIS — M21372 Foot drop, left foot: Secondary | ICD-10-CM

## 2023-04-05 DIAGNOSIS — F200 Paranoid schizophrenia: Secondary | ICD-10-CM

## 2023-04-05 DIAGNOSIS — I10 Essential (primary) hypertension: Secondary | ICD-10-CM

## 2023-04-05 DIAGNOSIS — F339 Major depressive disorder, recurrent, unspecified: Secondary | ICD-10-CM

## 2023-05-09 ENCOUNTER — Other Ambulatory Visit: Payer: Self-pay | Admitting: Family Medicine

## 2023-05-09 DIAGNOSIS — F200 Paranoid schizophrenia: Secondary | ICD-10-CM

## 2023-05-09 DIAGNOSIS — F339 Major depressive disorder, recurrent, unspecified: Secondary | ICD-10-CM

## 2023-05-09 DIAGNOSIS — M21372 Foot drop, left foot: Secondary | ICD-10-CM

## 2023-05-17 ENCOUNTER — Encounter: Payer: Self-pay | Admitting: *Deleted

## 2023-06-03 ENCOUNTER — Other Ambulatory Visit: Payer: Self-pay | Admitting: Family Medicine

## 2023-06-03 DIAGNOSIS — F339 Major depressive disorder, recurrent, unspecified: Secondary | ICD-10-CM

## 2023-06-03 DIAGNOSIS — F200 Paranoid schizophrenia: Secondary | ICD-10-CM

## 2023-06-15 ENCOUNTER — Other Ambulatory Visit: Payer: Self-pay | Admitting: Family Medicine

## 2023-06-15 DIAGNOSIS — M21372 Foot drop, left foot: Secondary | ICD-10-CM

## 2023-07-06 ENCOUNTER — Other Ambulatory Visit: Payer: Self-pay | Admitting: Family Medicine

## 2023-07-06 DIAGNOSIS — F200 Paranoid schizophrenia: Secondary | ICD-10-CM

## 2023-07-06 DIAGNOSIS — M21372 Foot drop, left foot: Secondary | ICD-10-CM

## 2023-07-06 DIAGNOSIS — F339 Major depressive disorder, recurrent, unspecified: Secondary | ICD-10-CM

## 2023-07-07 ENCOUNTER — Other Ambulatory Visit: Payer: Self-pay

## 2023-07-07 DIAGNOSIS — J309 Allergic rhinitis, unspecified: Secondary | ICD-10-CM

## 2023-07-07 MED ORDER — FLUTICASONE PROPIONATE 50 MCG/ACT NA SUSP
2.0000 | Freq: Every day | NASAL | 1 refills | Status: AC | PRN
Start: 2023-07-07 — End: ?

## 2023-07-18 ENCOUNTER — Encounter: Payer: Self-pay | Admitting: Family Medicine

## 2023-07-18 ENCOUNTER — Ambulatory Visit (INDEPENDENT_AMBULATORY_CARE_PROVIDER_SITE_OTHER): Payer: Medicare HMO | Admitting: Family Medicine

## 2023-07-18 VITALS — BP 138/86 | HR 69 | Ht 75.0 in | Wt 182.0 lb

## 2023-07-18 DIAGNOSIS — G8929 Other chronic pain: Secondary | ICD-10-CM | POA: Insufficient documentation

## 2023-07-18 DIAGNOSIS — M25511 Pain in right shoulder: Secondary | ICD-10-CM

## 2023-07-18 DIAGNOSIS — A64 Unspecified sexually transmitted disease: Secondary | ICD-10-CM | POA: Diagnosis not present

## 2023-07-18 DIAGNOSIS — M21372 Foot drop, left foot: Secondary | ICD-10-CM | POA: Diagnosis not present

## 2023-07-18 DIAGNOSIS — E038 Other specified hypothyroidism: Secondary | ICD-10-CM | POA: Diagnosis not present

## 2023-07-18 DIAGNOSIS — R7301 Impaired fasting glucose: Secondary | ICD-10-CM | POA: Diagnosis not present

## 2023-07-18 DIAGNOSIS — I10 Essential (primary) hypertension: Secondary | ICD-10-CM | POA: Diagnosis not present

## 2023-07-18 DIAGNOSIS — E559 Vitamin D deficiency, unspecified: Secondary | ICD-10-CM | POA: Diagnosis not present

## 2023-07-18 DIAGNOSIS — E7849 Other hyperlipidemia: Secondary | ICD-10-CM

## 2023-07-18 MED ORDER — ACETAMINOPHEN ER 650 MG PO TBCR
650.0000 mg | EXTENDED_RELEASE_TABLET | Freq: Three times a day (TID) | ORAL | 1 refills | Status: DC | PRN
Start: 2023-07-18 — End: 2023-11-28

## 2023-07-18 MED ORDER — PREDNISONE 20 MG PO TABS
40.0000 mg | ORAL_TABLET | Freq: Every day | ORAL | 0 refills | Status: AC
Start: 2023-07-18 — End: 2023-07-23

## 2023-07-18 MED ORDER — BACLOFEN 10 MG PO TABS: 10.0000 mg | ORAL_TABLET | Freq: Every evening | ORAL | 0 refills | Status: DC | PRN

## 2023-07-18 MED ORDER — GABAPENTIN 400 MG PO CAPS: 400.0000 mg | ORAL_CAPSULE | Freq: Four times a day (QID) | ORAL | 0 refills | Status: DC

## 2023-07-18 NOTE — Assessment & Plan Note (Signed)
-  You are currently under the care of orthopedic surgery with Dr. Hilda Lias. Please call to schedule an appointment at [phone]. Refill gabapentin

## 2023-07-18 NOTE — Patient Instructions (Addendum)
I appreciate the opportunity to provide care to you today!    Follow up:  4 months  Labs: please stop by the lab today to get your blood drawn (CBC, CMP, TSH, Lipid profile, HgA1c, Vit D)   Chronic Right Shoulder Pain:  -A prescription for prednisone 40 mg daily for 5 days has been sent to your pharmacy to help decrease inflammation and assist with pain relief in your right shoulder.  -I recommend not taking NSAID products while on prednisone, as these increase the risk of GI bleeding and ulcers.  -A prescription for Tylenol has also been sent to your pharmacy to take as needed for pain relief. - Additionally, a prescription for baclofen to take as needed at bedtime has been sent to your pharmacy.  Please review the exercises for the shoulder attached to your After Visit Summary (AVS) and perform them to strengthen the shoulder muscles and improve flexibility.  Left Foot Drop: -You are currently under the care of orthopedic surgery with Dr. Hilda Lias. Please call to schedule an appointment at [phone].   Attached with your AVS, you will find valuable resources for self-education. I highly recommend dedicating some time to thoroughly examine them.   Please continue to a heart-healthy diet and increase your physical activities. Try to exercise for at least five days a week.    It was a pleasure to see you and I look forward to continuing to work together on your health and well-being. Please do not hesitate to call the office if you need care or have questions about your care.  In case of emergency, please visit the Emergency Department for urgent care, or contact our clinic at [phone] to schedule an appointment. We're here to help you!   Have a wonderful day and week. With Gratitude, Gilmore Laroche MSN, FNP-BC

## 2023-07-18 NOTE — Assessment & Plan Note (Signed)
-  A prescription for prednisone 40 mg daily for 5 days has been sent to your pharmacy to help decrease inflammation and assist with pain relief in your right shoulder.  -I recommend not taking NSAID products while on prednisone, as these increase the risk of GI bleeding and ulcers.  -A prescription for Tylenol has also been sent to your pharmacy to take as needed for pain relief. - Additionally, a prescription for baclofen to take as needed at bedtime has been sent to your pharmacy.  Please review the exercises for the shoulder attached to your After Visit Summary (AVS) and perform them to strengthen the shoulder muscles and improve flexibility.

## 2023-07-18 NOTE — Assessment & Plan Note (Signed)
The patient is currently asymptomatic but reports recent sexual activity and requests to be tested for STDs today.

## 2023-07-18 NOTE — Progress Notes (Addendum)
Established Patient Office Visit  Subjective:  Patient ID: Gregory Payne, male    DOB: Sep 21, 1961  Age: 62 y.o. MRN: [id number]  CC:  Chief Complaint  Patient presents with   Care Management    Following up , need to discuss referral to ortho. Reports left arm shooting pain, pain level is a 7/10, needs to discuss gabapentin    HPI Gregory Payne is a 62 y.o. male presents with complaints of chronic left shoulder pain.  Chronic Right Shoulder Pain: The patient reports chronic left shoulder pain that worsens with changes in barometric pressure. He complains of stiffness in the shoulder and difficulty reaching over his head. He rates the pain as 7/10 and describes it as a shooting pain. He denies neck pain or stiffness, and no recent injury or trauma is reported. The range of motion of the neck is intact.  Past Medical History:  Diagnosis Date   Anxiety    Chronic pain    Depression    Diabetes mellitus    Fibromyalgia    GERD (gastroesophageal reflux disease)    Hypercholesteremia    Hypertension    Kidney stones    Schizophrenia [facility])     Past Surgical History:  Procedure Laterality Date   CERVICAL DISC SURGERY     ESOPHAGOGASTRODUODENOSCOPY  01/28/2012   Procedure: ESOPHAGOGASTRODUODENOSCOPY (EGD);  Surgeon: Malissa Hippo, MD;  [facility]: [facility];  Service: Endoscopy;  Laterality: N/A;  1200   HERNIA REPAIR     left inguinal hernia   LITHOTRIPSY      Family History  Problem Relation Age of Onset   Diabetes Mother    Kidney failure Mother    Hyperlipidemia Father    Alcoholism Father    Arthritis Daughter    Colon cancer Neg Hx    Prostate cancer Neg Hx     Social History   Socioeconomic History   Marital status: Single    Spouse name: Not on file   Number of children: 1   Years of education: 8th   Highest education level: Not on file  Occupational History   Not on file  Tobacco Use   Smoking status: Every Day    Current packs/day: 0.50     Average packs/day: 0.5 packs/day for 35.0 years (17.5 ttl pk-yrs)    Types: Cigarettes   Smokeless tobacco: Not on file   Tobacco comments:    He has been smoking since 62 years old. He is not ready to quit smoking.  Vaping Use   Vaping status: Every Day   Devices: he started vaping this year after his former PCP left.  Substance and Sexual Activity   Alcohol use: Yes    Comment: one 24 ounce can of beer once a week.   Drug use: Yes    Types: Marijuana    Comment: he has been using marijuana since he was 17, he has used cocaine in the past.   Sexual activity: Yes  Other Topics Concern   Not on file  Social History Narrative   Pt lives by himself, he has not been around people  because of his mental illness.    Social Determinants of Health   Financial Resource Strain: Low Risk  (11/12/2021)   Overall Financial Resource Strain (CARDIA)    Difficulty of Paying Living Expenses: Not hard at all  Food Insecurity: No Food Insecurity (11/12/2021)   Hunger Vital Sign    Worried About Running Out of Food in  the Last Year: Never true    Ran Out of Food in the Last Year: Never true  Transportation Needs: No Transportation Needs (11/12/2021)   PRAPARE - Administrator, Civil Service (Medical): No    Lack of Transportation (Non-Medical): No  Physical Activity: Insufficiently Active (11/06/2021)   Exercise Vital Sign    Days of Exercise per Week: 1 day    Minutes of Exercise per Session: 10 min  Stress: Stress Concern Present (11/06/2021)   [employer] of Occupational Health - Occupational Stress Questionnaire    Feeling of Stress : To some extent  Social Connections: Socially Isolated (11/12/2021)   Social Connection and Isolation Panel [NHANES]    Frequency of Communication with Friends and Family: Twice a week    Frequency of Social Gatherings with Friends and Family: Once a week    Attends Religious Services: Never    Database administrator or Organizations: No     Attends Banker Meetings: Never    Marital Status: Divorced  Catering manager Violence: Not At Risk (11/12/2021)   Humiliation, Afraid, Rape, and Kick questionnaire    Fear of Current or Ex-Partner: No    Emotionally Abused: No    Physically Abused: No    Sexually Abused: No    Outpatient Medications Prior to Visit  Medication Sig Dispense Refill   amLODipine (NORVASC) 5 MG tablet TAKE 1 TABLET BY MOUTH ONCE A DAY. 90 tablet 0   cetirizine (ZYRTEC ALLERGY) 10 MG tablet Take 1 tablet (10 mg total) by mouth daily. 60 tablet 1   cyanocobalamin 1000 MCG tablet Take 1,000 mcg by mouth daily.     docusate sodium (COLACE) 100 MG capsule Take 1 capsule (100 mg total) by mouth daily as needed for mild constipation. Reported on 01/14/2016 30 capsule 1   DULoxetine (CYMBALTA) 30 MG capsule TAKE ONE CAPSULE BY MOUTH ONCE DAILY. 30 capsule 0   fluticasone (FLONASE) 50 MCG/ACT nasal spray Place 2 sprays into both nostrils daily as needed for allergies or rhinitis. 15.8 mL 1   meloxicam (MOBIC) 15 MG tablet Take 15 mg by mouth daily.     OLANZapine (ZYPREXA) 5 MG tablet TAKE (1) TABLET BY MOUTH TWICE DAILY. 60 tablet 0   omeprazole (PRILOSEC) 20 MG capsule TAKE 1 CAPSULE BY MOUTH ONCE DAILY FOR STOMACH. 90 capsule 0   pravastatin (PRAVACHOL) 80 MG tablet Take 1 tablet (80 mg total) by mouth daily. 90 tablet 3   TRINTELLIX 5 MG TABS tablet TAKE (1) TABLET BY MOUTH DAILY 90 tablet 0   VITAMIN D, CHOLECALCIFEROL, PO Take by mouth.     gabapentin (NEURONTIN) 300 MG capsule TAKE 1 CAPSULE BY MOUTH FOUR TIMES A DAY. 120 capsule 0   gabapentin (NEURONTIN) 400 MG capsule TAKE 1 CAPSULE BY MOUTH FOUR TIMES DAILY. 120 capsule 0   No facility-administered medications prior to visit.    Allergies  Allergen Reactions   Naproxen     REACTION: GI bleed from internal Hemmorhoids    ROS Review of Systems  Constitutional:  Negative for fatigue and fever.  Eyes:  Negative for visual disturbance.   Respiratory:  Negative for chest tightness and shortness of breath.   Cardiovascular:  Negative for chest pain and palpitations.  Musculoskeletal:  Positive for arthralgias.  Neurological:  Negative for dizziness and headaches.      Objective:    Physical Exam HENT:     Head: Normocephalic.     Right Ear: External  ear normal.     Left Ear: External ear normal.     Nose: No congestion or rhinorrhea.     Mouth/Throat:     Mouth: Mucous membranes are moist.  Cardiovascular:     Rate and Rhythm: Regular rhythm.     Heart sounds: No murmur heard. Pulmonary:     Effort: No respiratory distress.     Breath sounds: Normal breath sounds.  Musculoskeletal:     Right shoulder: No deformity or effusion. Decreased range of motion.     Left shoulder: No swelling, deformity or effusion. Normal range of motion.  Neurological:     Mental Status: He is alert.     BP 138/86 (BP Location: Left Arm)   Pulse 69   Ht 6\' 3"  (1.905 m)   Wt 182 lb 0.6 oz (82.6 kg)   SpO2 97%   BMI 22.75 kg/m  Wt Readings from Last 3 Encounters:  07/18/23 182 lb 0.6 oz (82.6 kg)  03/01/23 187 lb (84.8 kg)  12/14/22 187 lb (84.8 kg)    Lab Results  Component Value Date   TSH 2.110 11/16/2022   Lab Results  Component Value Date   WBC 7.6 11/16/2022   HGB 13.1 11/16/2022   HCT 39.7 11/16/2022   MCV 83 11/16/2022   PLT 247 11/16/2022   Lab Results  Component Value Date   NA 142 11/16/2022   K 4.4 11/16/2022   CO2 24 11/16/2022   GLUCOSE 96 11/16/2022   BUN 12 11/16/2022   CREATININE 1.02 11/16/2022   BILITOT 0.3 11/16/2022   ALKPHOS 111 11/16/2022   AST 16 11/16/2022   ALT 11 11/16/2022   PROT 6.5 11/16/2022   ALBUMIN 3.7 (L) 11/16/2022   CALCIUM 9.0 11/16/2022   EGFR 83 11/16/2022   Lab Results  Component Value Date   CHOL 171 11/16/2022   Lab Results  Component Value Date   HDL 47 11/16/2022   Lab Results  Component Value Date   LDLCALC 109 (H) 11/16/2022   Lab Results   Component Value Date   TRIG 79 11/16/2022   Lab Results  Component Value Date   CHOLHDL 3.6 11/16/2022   Lab Results  Component Value Date   HGBA1C 6.0 (H) 11/16/2022      Assessment & Plan:  Chronic right shoulder pain Assessment & Plan: -A prescription for prednisone 40 mg daily for 5 days has been sent to your pharmacy to help decrease inflammation and assist with pain relief in your right shoulder.  -I recommend not taking NSAID products while on prednisone, as these increase the risk of GI bleeding and ulcers.  -A prescription for Tylenol has also been sent to your pharmacy to take as needed for pain relief. - Additionally, a prescription for baclofen to take as needed at bedtime has been sent to your pharmacy.  Please review the exercises for the shoulder attached to your After Visit Summary (AVS) and perform them to strengthen the shoulder muscles and improve flexibility.  Orders: -     Baclofen; Take 1 tablet (10 mg total) by mouth at bedtime as needed for muscle spasms.  Dispense: 30 each; Refill: 0 -     Acetaminophen ER; Take 1 tablet (650 mg total) by mouth every 8 (eight) hours as needed for pain.  Dispense: 60 tablet; Refill: 1 -     predniSONE; Take 2 tablets (40 mg total) by mouth daily for 5 days.  Dispense: 10 tablet; Refill: 0  Foot drop,  left Assessment & Plan: -You are currently under the care of orthopedic surgery with Dr. Hilda Lias. Please call to schedule an appointment at [phone]. Refill gabapentin  Orders: -     Gabapentin; Take 1 capsule (400 mg total) by mouth 4 (four) times daily.  Dispense: 120 capsule; Refill: 0  STD (male) Assessment & Plan: The patient is currently asymptomatic but reports recent sexual activity and requests to be tested for STDs today.  Orders: -     T.pallidum Ab, Total -     HIV Antibody (routine testing w rflx) -     Chlamydia/GC NAA, Confirmation -     HSV 1 and 2 Ab, IgG  IFG (impaired fasting glucose) -      Hemoglobin A1c  Vitamin D deficiency -     VITAMIN D 25 Hydroxy (Vit-D Deficiency, Fractures)  Other specified hypothyroidism -     TSH + free T4  Essential hypertension -     CMP14+EGFR -     CBC with Differential/Platelet  Other hyperlipidemia -     Lipid panel  Note: This chart has been completed using Engineer, civil (consulting) software, and while attempts have been made to ensure accuracy, certain words and phrases may not be transcribed as intended.    Follow-up: Return in about 4 months (around 11/18/2023).   Gilmore Laroche, FNP

## 2023-07-20 LAB — CHLAMYDIA/GC NAA, CONFIRMATION
Chlamydia trachomatis, NAA: NEGATIVE
Neisseria gonorrhoeae, NAA: NEGATIVE

## 2023-07-26 ENCOUNTER — Other Ambulatory Visit: Payer: Self-pay | Admitting: Family Medicine

## 2023-07-26 DIAGNOSIS — F339 Major depressive disorder, recurrent, unspecified: Secondary | ICD-10-CM

## 2023-07-26 DIAGNOSIS — F200 Paranoid schizophrenia: Secondary | ICD-10-CM

## 2023-08-22 ENCOUNTER — Other Ambulatory Visit: Payer: Self-pay | Admitting: Family Medicine

## 2023-08-22 DIAGNOSIS — I10 Essential (primary) hypertension: Secondary | ICD-10-CM

## 2023-09-11 ENCOUNTER — Other Ambulatory Visit: Payer: Self-pay | Admitting: Family Medicine

## 2023-09-11 DIAGNOSIS — I10 Essential (primary) hypertension: Secondary | ICD-10-CM

## 2023-09-28 ENCOUNTER — Other Ambulatory Visit: Payer: Self-pay | Admitting: Family Medicine

## 2023-09-28 DIAGNOSIS — M21372 Foot drop, left foot: Secondary | ICD-10-CM

## 2023-09-28 DIAGNOSIS — F339 Major depressive disorder, recurrent, unspecified: Secondary | ICD-10-CM

## 2023-09-28 DIAGNOSIS — F200 Paranoid schizophrenia: Secondary | ICD-10-CM

## 2023-10-17 ENCOUNTER — Telehealth: Payer: Self-pay | Admitting: Orthopaedic Surgery

## 2023-10-17 NOTE — Telephone Encounter (Signed)
 Dr. Sanjuan Dame pt Gregory Payne w/social services [phone] lvm on 10/14/23 at 3:17pm wanting to schedule this pt.  I called her back today, lvm to call me back so we can schedule.

## 2023-10-24 ENCOUNTER — Other Ambulatory Visit: Payer: Self-pay | Admitting: Family Medicine

## 2023-10-24 DIAGNOSIS — F339 Major depressive disorder, recurrent, unspecified: Secondary | ICD-10-CM

## 2023-10-24 DIAGNOSIS — M21372 Foot drop, left foot: Secondary | ICD-10-CM

## 2023-10-24 DIAGNOSIS — I10 Essential (primary) hypertension: Secondary | ICD-10-CM

## 2023-10-24 DIAGNOSIS — F200 Paranoid schizophrenia: Secondary | ICD-10-CM

## 2023-10-31 ENCOUNTER — Ambulatory Visit: Payer: Self-pay | Admitting: Family Medicine

## 2023-11-18 ENCOUNTER — Ambulatory Visit: Payer: Medicare HMO | Admitting: Family Medicine

## 2023-11-28 ENCOUNTER — Ambulatory Visit (INDEPENDENT_AMBULATORY_CARE_PROVIDER_SITE_OTHER): Payer: Medicare HMO | Admitting: Family Medicine

## 2023-11-28 ENCOUNTER — Encounter: Payer: Self-pay | Admitting: Family Medicine

## 2023-11-28 ENCOUNTER — Other Ambulatory Visit: Payer: Self-pay | Admitting: Family Medicine

## 2023-11-28 ENCOUNTER — Ambulatory Visit: Payer: Medicare HMO | Admitting: Family Medicine

## 2023-11-28 VITALS — BP 157/88 | HR 73 | Ht 75.0 in | Wt 192.1 lb

## 2023-11-28 DIAGNOSIS — R7301 Impaired fasting glucose: Secondary | ICD-10-CM | POA: Diagnosis not present

## 2023-11-28 DIAGNOSIS — E559 Vitamin D deficiency, unspecified: Secondary | ICD-10-CM | POA: Diagnosis not present

## 2023-11-28 DIAGNOSIS — S81802A Unspecified open wound, left lower leg, initial encounter: Secondary | ICD-10-CM

## 2023-11-28 DIAGNOSIS — E038 Other specified hypothyroidism: Secondary | ICD-10-CM | POA: Diagnosis not present

## 2023-11-28 DIAGNOSIS — M21372 Foot drop, left foot: Secondary | ICD-10-CM

## 2023-11-28 DIAGNOSIS — I10 Essential (primary) hypertension: Secondary | ICD-10-CM

## 2023-11-28 DIAGNOSIS — M25511 Pain in right shoulder: Secondary | ICD-10-CM | POA: Diagnosis not present

## 2023-11-28 DIAGNOSIS — F200 Paranoid schizophrenia: Secondary | ICD-10-CM

## 2023-11-28 DIAGNOSIS — G8929 Other chronic pain: Secondary | ICD-10-CM

## 2023-11-28 DIAGNOSIS — E7849 Other hyperlipidemia: Secondary | ICD-10-CM | POA: Diagnosis not present

## 2023-11-28 DIAGNOSIS — E119 Type 2 diabetes mellitus without complications: Secondary | ICD-10-CM

## 2023-11-28 DIAGNOSIS — F339 Major depressive disorder, recurrent, unspecified: Secondary | ICD-10-CM

## 2023-11-28 DIAGNOSIS — G629 Polyneuropathy, unspecified: Secondary | ICD-10-CM | POA: Diagnosis not present

## 2023-11-28 MED ORDER — ACETAMINOPHEN ER 650 MG PO TBCR: 650.0000 mg | EXTENDED_RELEASE_TABLET | Freq: Three times a day (TID) | ORAL | 1 refills | Status: AC | PRN

## 2023-11-28 MED ORDER — PREDNISONE 20 MG PO TABS: 40.0000 mg | ORAL_TABLET | Freq: Every day | ORAL | 0 refills | Status: AC

## 2023-11-28 MED ORDER — VITAMIN B-6 50 MG PO TABS: 50.0000 mg | ORAL_TABLET | Freq: Every day | ORAL | 1 refills | Status: AC

## 2023-11-28 MED ORDER — BACLOFEN 10 MG PO TABS: 10.0000 mg | ORAL_TABLET | Freq: Every evening | ORAL | 1 refills | Status: AC | PRN

## 2023-11-28 MED ORDER — SILVER SULFADIAZINE 1 % EX CREA: 1.0000 | TOPICAL_CREAM | Freq: Every day | CUTANEOUS | 0 refills | Status: AC
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
## 2023-11-28 NOTE — Patient Instructions (Addendum)
I appreciate the opportunity to provide care to you today!    Follow up:  1 months  Labs: please stop by the lab during the week to get your blood drawn (CBC, CMP, TSH, Lipid profile, HgA1c, Vit D)  Hypertension Management  Your current blood pressure is above the target goal of <140/90 mmHg. To address this, please continue taking amlodipine 5 mg daily   Medication Instructions: Take your blood pressure medication at the same time each day. After taking your medication, check your blood pressure at least an hour later. If your first reading is >140/90 mmHg, wait at least 10 minutes and recheck your blood pressure. Side Effects: In the initial days of therapy, you may experience dizziness or lightheadedness as your body adjusts to the lower blood pressure; this is expected. Diet and Lifestyle: Adhere to a low-sodium diet, limiting intake to less than 1500 mg daily, and increase your physical activity. Avoid over-the-counter NSAIDs such as ibuprofen and naproxen while on this medication. Hydration and Nutrition: Stay well-hydrated by drinking at least 64 ounces of water daily. Increase your servings of fruits and vegetables and avoid excessive sodium in your diet. Long-Term Considerations: Uncontrolled hypertension can increase the risk of cardiovascular diseases, including stroke, coronary artery disease, and heart failure.  Please report to the emergency department if your blood pressure exceeds 180/120 and is accompanied by symptoms such as headaches, chest pain, palpitations, blurred vision, or dizziness.    Left Foot Drop Referral placed to orthopedic surgery for further evaluation and management.  Wound of the Left Lower Extremity Referral placed to wound care for specialized treatment. Start applying Silver Sulfadiazine 1% cream topically once daily to promote healing and prevent infection. Monitor for signs of infection, including: Increased redness, swelling, or warmth around the  wound. Pus or foul-smelling drainage. Increased pain or tenderness. Fever or chills. keep the wound clean and dry and to follow up promptly if any signs of infection develop.  Chronic Right Shoulder Pain - Treatment Plan Start taking Prednisone 40 mg daily for five days to help reduce inflammation. A refill of Baclofen 10 mg to take at bedtime has been sent to your pharmacy for muscle relaxation. Tylenol 650 mg every eight hours as needed for pain management. Referral placed to orthopedic surgery for further evaluation and treatment options. Please stop by [facility] to get an X-ray of the right shoulder to assess for any underlying pathology.  Neuropathy - Treatment Plan Continue taking Gabapentin 400 mg four times daily for nerve pain management. A prescription for Pyridoxine (Vitamin B6) 50 mg daily has been sent to your pharmacy to start taking. Encouraged to monitor for symptom progression and report any worsening numbness, tingling, burning sensations, or weakness. Lifestyle modifications such as regular exercise, proper foot care , and avoiding prolonged pressure on affected areas were recommended. Follow up as scheduled to assess treatment effectiveness and adjust the regimen if needed.   Referrals today- Orthopedic surgery (Dr. Consuello Closs), Wound care [facility])    Please continue to a heart-healthy diet and increase your physical activities. Try to exercise for at least five days a week.    It was a pleasure to see you and I look forward to continuing to work together on your health and well-being. Please do not hesitate to call the office if you need care or have questions about your care.  In case of emergency, please visit the Emergency Department for urgent care, or contact our clinic  at [phone] to schedule an appointment. We're here to help you!   Have a wonderful day and week. With Gratitude, Gilmore Laroche MSN, FNP-BC

## 2023-11-30 DIAGNOSIS — G629 Polyneuropathy, unspecified: Secondary | ICD-10-CM | POA: Insufficient documentation

## 2023-11-30 DIAGNOSIS — S81802A Unspecified open wound, left lower leg, initial encounter: Secondary | ICD-10-CM | POA: Insufficient documentation

## 2023-11-30 NOTE — Assessment & Plan Note (Signed)
He is encouraged to follow up with orthopedic surgery with Dr. Hilda Lias for a new AFO and knee brace.

## 2023-11-30 NOTE — Assessment & Plan Note (Signed)
Continue taking Gabapentin 400 mg four times daily for nerve pain management. A prescription for Pyridoxine (Vitamin B6) 50 mg daily has been sent to your pharmacy to start taking. Encouraged to monitor for symptom progression and report any worsening numbness, tingling, burning sensations, or weakness. Lifestyle modifications such as regular exercise, proper foot care , and avoiding prolonged pressure on affected areas were recommended. Follow up as scheduled to assess treatment effectiveness and adjust the regimen if needed.

## 2023-11-30 NOTE — Assessment & Plan Note (Signed)
Chronic Right Shoulder Pain - Treatment Plan Start taking Prednisone 40 mg daily for five days to help reduce inflammation. A refill of Baclofen 10 mg to take at bedtime has been sent to your pharmacy for muscle relaxation. Tylenol 650 mg every eight hours as needed for pain management. Referral placed to orthopedic surgery for further evaluation and treatment options. Please stop by [facility] to get an X-ray of the right shoulder to assess for any underlying pathology.

## 2023-11-30 NOTE — Assessment & Plan Note (Signed)
Referral placed to wound care for specialized treatment. Start applying Silver Sulfadiazine 1% cream topically once daily to promote healing and prevent infection. Monitor for signs of infection, including: Increased redness, swelling, or warmth around the wound. Pus or foul-smelling drainage. Increased pain or tenderness. Fever or chills. keep the wound clean and dry and to follow up promptly if any signs of infection develop.

## 2023-12-02 ENCOUNTER — Telehealth: Payer: Self-pay | Admitting: Family Medicine

## 2023-12-02 DIAGNOSIS — E038 Other specified hypothyroidism: Secondary | ICD-10-CM | POA: Diagnosis not present

## 2023-12-02 DIAGNOSIS — E7849 Other hyperlipidemia: Secondary | ICD-10-CM | POA: Diagnosis not present

## 2023-12-02 DIAGNOSIS — E559 Vitamin D deficiency, unspecified: Secondary | ICD-10-CM | POA: Diagnosis not present

## 2023-12-02 DIAGNOSIS — R7301 Impaired fasting glucose: Secondary | ICD-10-CM | POA: Diagnosis not present

## 2023-12-02 NOTE — Telephone Encounter (Signed)
Patient asked to reach out to his case worker manager Delrae Rend phone # [phone] when lab results are back.

## 2023-12-03 LAB — CMP14+EGFR
ALT: 24 [IU]/L (ref 0–44)
AST: 25 [IU]/L (ref 0–40)
Albumin: 4.5 g/dL (ref 3.9–4.9)
Alkaline Phosphatase: 108 [IU]/L (ref 44–121)
BUN/Creatinine Ratio: 17 (ref 10–24)
BUN: 15 mg/dL (ref 8–27)
Bilirubin Total: 0.4 mg/dL (ref 0.0–1.2)
CO2: 23 mmol/L (ref 20–29)
Calcium: 9.8 mg/dL (ref 8.6–10.2)
Chloride: 105 mmol/L (ref 96–106)
Creatinine, Ser: 0.87 mg/dL (ref 0.76–1.27)
Globulin, Total: 3.1 g/dL (ref 1.5–4.5)
Glucose: 92 mg/dL (ref 70–99)
Potassium: 4 mmol/L (ref 3.5–5.2)
Sodium: 143 mmol/L (ref 134–144)
Total Protein: 7.6 g/dL (ref 6.0–8.5)
eGFR: 97 mL/min/{1.73_m2} (ref >59)

## 2023-12-03 LAB — CBC WITH DIFFERENTIAL/PLATELET
Basophils Absolute: 0.1 10*3/uL (ref 0.0–0.2)
Basos: 1 %
EOS (ABSOLUTE): 0.1 10*3/uL (ref 0.0–0.4)
Eos: 1 %
Hematocrit: 45.2 % (ref 37.5–51.0)
Hemoglobin: 14.1 g/dL (ref 13.0–17.7)
Immature Grans (Abs): 0 10*3/uL (ref 0.0–0.1)
Immature Granulocytes: 0 %
Lymphocytes Absolute: 2.3 10*3/uL (ref 0.7–3.1)
Lymphs: 24 %
MCH: 26.1 pg — ABNORMAL LOW (ref 26.6–33.0)
MCHC: 31.2 g/dL — ABNORMAL LOW (ref 31.5–35.7)
MCV: 84 fL (ref 79–97)
Monocytes Absolute: 0.5 10*3/uL (ref 0.1–0.9)
Monocytes: 5 %
Neutrophils Absolute: 6.7 10*3/uL (ref 1.4–7.0)
Neutrophils: 69 %
Platelets: 274 10*3/uL (ref 150–450)
RBC: 5.4 x10E6/uL (ref 4.14–5.80)
RDW: 13.2 % (ref 11.6–15.4)
WBC: 9.6 10*3/uL (ref 3.4–10.8)

## 2023-12-03 LAB — HEMOGLOBIN A1C
Est. average glucose Bld gHb Est-mCnc: 137 mg/dL
Hgb A1c MFr Bld: 6.4 % — ABNORMAL HIGH (ref 4.8–5.6)

## 2023-12-03 LAB — LIPID PANEL
Chol/HDL Ratio: 3.2 {ratio} (ref 0.0–5.0)
Cholesterol, Total: 192 mg/dL (ref 100–199)
HDL: 60 mg/dL (ref >39)
LDL Chol Calc (NIH): 119 mg/dL — ABNORMAL HIGH (ref 0–99)
Triglycerides: 71 mg/dL (ref 0–149)
VLDL Cholesterol Cal: 13 mg/dL (ref 5–40)

## 2023-12-03 LAB — TSH+FREE T4
Free T4: 1.2 ng/dL (ref 0.82–1.77)
TSH: 1.74 u[IU]/mL (ref 0.450–4.500)

## 2023-12-03 LAB — VITAMIN D 25 HYDROXY (VIT D DEFICIENCY, FRACTURES): Vit D, 25-Hydroxy: 21.1 ng/mL — ABNORMAL LOW (ref 30.0–100.0)

## 2023-12-05 ENCOUNTER — Other Ambulatory Visit: Payer: Self-pay | Admitting: Family Medicine

## 2023-12-05 DIAGNOSIS — E119 Type 2 diabetes mellitus without complications: Secondary | ICD-10-CM

## 2023-12-05 DIAGNOSIS — E1165 Type 2 diabetes mellitus with hyperglycemia: Secondary | ICD-10-CM

## 2023-12-05 DIAGNOSIS — E1169 Type 2 diabetes mellitus with other specified complication: Secondary | ICD-10-CM

## 2023-12-05 DIAGNOSIS — E559 Vitamin D deficiency, unspecified: Secondary | ICD-10-CM

## 2023-12-05 MED ORDER — VITAMIN D (ERGOCALCIFEROL) 1.25 MG (50000 UNIT) PO CAPS: 50000.0000 [IU] | ORAL_CAPSULE | ORAL | 1 refills | Status: DC

## 2023-12-05 MED ORDER — METFORMIN HCL 500 MG PO TABS: 500.0000 mg | ORAL_TABLET | Freq: Every day | ORAL | 3 refills | Status: AC

## 2023-12-05 MED ORDER — EZETIMIBE 10 MG PO TABS: 10.0000 mg | ORAL_TABLET | Freq: Every day | ORAL | 3 refills | Status: DC

## 2023-12-05 NOTE — Progress Notes (Signed)
 Please inform the patient that a prescription for Metformin 500 mg daily has been sent to his pharmacy, as his Hemoglobin A1c is 6.4, indicating type II diabetes. I recommend reducing his intake of high-sugar foods and beverages while increasing physical activity as tolerated to help manage blood sugar levels.  His cholesterol levels are not at goal, and I recommend that he continue taking Pravastatin 80 mg daily. Additionally, a new prescription for Ezetimibe 10 mg daily has been added to his treatment regimen to further support cholesterol management.  His vitamin D levels are low, and a prescription for a weekly vitamin D supplement has been sent to his pharmacy.

## 2023-12-06 NOTE — Telephone Encounter (Signed)
 [email or web address]

## 2023-12-07 ENCOUNTER — Ambulatory Visit (INDEPENDENT_AMBULATORY_CARE_PROVIDER_SITE_OTHER): Payer: Medicare HMO | Admitting: Orthopaedic Surgery

## 2023-12-07 ENCOUNTER — Encounter: Payer: Self-pay | Admitting: Orthopaedic Surgery

## 2023-12-07 VITALS — BP 187/118 | HR 81 | Ht 75.0 in | Wt 192.0 lb

## 2023-12-07 DIAGNOSIS — G8929 Other chronic pain: Secondary | ICD-10-CM | POA: Diagnosis not present

## 2023-12-07 DIAGNOSIS — F17210 Nicotine dependence, cigarettes, uncomplicated: Secondary | ICD-10-CM

## 2023-12-07 DIAGNOSIS — M25562 Pain in left knee: Secondary | ICD-10-CM

## 2023-12-07 DIAGNOSIS — M21372 Foot drop, left foot: Secondary | ICD-10-CM | POA: Diagnosis not present

## 2023-12-07 NOTE — Patient Instructions (Signed)
 [facility]: Prosthetics & Orthotics  Orthotics & prosthetics service [address]  [phone]  Take order to [facility] for your brace, you need to call them first to make appointment to be fitted.

## 2023-12-07 NOTE — Progress Notes (Signed)
 My brace broke.  His AFO has cracked and he needs a new one.  Also the postioning in the foot needs to be addressed and perhaps a special shoe is needed.  He has a knee brace also and that needs to be changed.  I will send him back to the brace shop for the braces.  Order given to him to take to them.   He has a left foot drop and instability of the left knee with crepitus, effusion.  Pulses to foot is diminished but present.  He has abnormal gait and uses cane.  Encounter Diagnoses  Name Primary?   Foot drop, left Yes   Chronic pain of left knee    Nicotine dependence, cigarettes, uncomplicated    Return after getting the brace.  Call if any problem.  Precautions discussed.  Electronically Signed Darreld Mclean, MD 2/26/20258:14 AM

## 2023-12-16 ENCOUNTER — Telehealth: Payer: Self-pay | Admitting: Family Medicine

## 2023-12-16 NOTE — Telephone Encounter (Signed)
FL2  Noted  Copied Sleeved  Original in PCP box Copy front desk folder

## 2023-12-20 ENCOUNTER — Telehealth: Payer: Self-pay | Admitting: Orthopaedic Surgery

## 2023-12-20 NOTE — Telephone Encounter (Signed)
 Dr. Sanjuan Dame pt - spoke w/[facility] [phone], they need the notes for [facility] 12/07/23.  Faxed to [phone].

## 2024-01-02 ENCOUNTER — Other Ambulatory Visit: Payer: Self-pay | Admitting: Family Medicine

## 2024-01-02 ENCOUNTER — Telehealth: Payer: Self-pay | Admitting: Radiology

## 2024-01-02 DIAGNOSIS — F200 Paranoid schizophrenia: Secondary | ICD-10-CM

## 2024-01-02 DIAGNOSIS — M21372 Foot drop, left foot: Secondary | ICD-10-CM

## 2024-01-02 DIAGNOSIS — F339 Major depressive disorder, recurrent, unspecified: Secondary | ICD-10-CM

## 2024-01-02 NOTE — Telephone Encounter (Signed)
 Melissa called LM asking if we received the paper for Medicare and for Medicaid for Dr Hilda Lias to sign for the knee brace.  Please call her back to advise.

## 2024-01-12 ENCOUNTER — Ambulatory Visit: Payer: Medicare HMO | Admitting: Family Medicine

## 2024-01-17 DIAGNOSIS — M21062 Valgus deformity, not elsewhere classified, left knee: Secondary | ICD-10-CM | POA: Diagnosis not present

## 2024-01-18 ENCOUNTER — Ambulatory Visit: Payer: Medicare HMO

## 2024-01-27 ENCOUNTER — Other Ambulatory Visit: Payer: Self-pay | Admitting: Family Medicine

## 2024-01-27 DIAGNOSIS — M21372 Foot drop, left foot: Secondary | ICD-10-CM

## 2024-01-27 DIAGNOSIS — F200 Paranoid schizophrenia: Secondary | ICD-10-CM

## 2024-01-27 DIAGNOSIS — F339 Major depressive disorder, recurrent, unspecified: Secondary | ICD-10-CM

## 2024-02-27 ENCOUNTER — Other Ambulatory Visit: Payer: Self-pay | Admitting: Family Medicine

## 2024-02-27 DIAGNOSIS — I10 Essential (primary) hypertension: Secondary | ICD-10-CM

## 2024-02-27 DIAGNOSIS — F339 Major depressive disorder, recurrent, unspecified: Secondary | ICD-10-CM

## 2024-02-27 DIAGNOSIS — M21372 Foot drop, left foot: Secondary | ICD-10-CM

## 2024-02-27 DIAGNOSIS — F200 Paranoid schizophrenia: Secondary | ICD-10-CM

## 2024-03-28 ENCOUNTER — Other Ambulatory Visit: Payer: Self-pay | Admitting: Family Medicine

## 2024-03-28 DIAGNOSIS — F200 Paranoid schizophrenia: Secondary | ICD-10-CM

## 2024-03-28 DIAGNOSIS — M21372 Foot drop, left foot: Secondary | ICD-10-CM

## 2024-03-28 DIAGNOSIS — F339 Major depressive disorder, recurrent, unspecified: Secondary | ICD-10-CM

## 2024-03-30 ENCOUNTER — Ambulatory Visit: Admitting: Family Medicine

## 2024-04-03 ENCOUNTER — Encounter: Payer: Self-pay | Admitting: Family Medicine

## 2024-04-26 ENCOUNTER — Other Ambulatory Visit: Payer: Self-pay | Admitting: Family Medicine

## 2024-04-26 DIAGNOSIS — F200 Paranoid schizophrenia: Secondary | ICD-10-CM

## 2024-04-26 DIAGNOSIS — F339 Major depressive disorder, recurrent, unspecified: Secondary | ICD-10-CM

## 2024-04-26 DIAGNOSIS — M21372 Foot drop, left foot: Secondary | ICD-10-CM

## 2024-05-16 ENCOUNTER — Other Ambulatory Visit: Payer: Self-pay | Admitting: Family Medicine

## 2024-05-16 DIAGNOSIS — G8929 Other chronic pain: Secondary | ICD-10-CM

## 2024-05-25 ENCOUNTER — Other Ambulatory Visit: Payer: Self-pay | Admitting: Family Medicine

## 2024-05-25 DIAGNOSIS — F339 Major depressive disorder, recurrent, unspecified: Secondary | ICD-10-CM

## 2024-05-25 DIAGNOSIS — I10 Essential (primary) hypertension: Secondary | ICD-10-CM

## 2024-05-25 DIAGNOSIS — M21372 Foot drop, left foot: Secondary | ICD-10-CM

## 2024-05-25 DIAGNOSIS — G8929 Other chronic pain: Secondary | ICD-10-CM

## 2024-05-25 DIAGNOSIS — F200 Paranoid schizophrenia: Secondary | ICD-10-CM

## 2024-06-26 ENCOUNTER — Other Ambulatory Visit: Payer: Self-pay | Admitting: Family Medicine

## 2024-06-26 DIAGNOSIS — F339 Major depressive disorder, recurrent, unspecified: Secondary | ICD-10-CM

## 2024-06-26 DIAGNOSIS — F200 Paranoid schizophrenia: Secondary | ICD-10-CM

## 2024-06-26 DIAGNOSIS — M21372 Foot drop, left foot: Secondary | ICD-10-CM

## 2024-07-11 ENCOUNTER — Encounter: Payer: Self-pay | Admitting: Orthopaedic Surgery

## 2024-07-11 ENCOUNTER — Ambulatory Visit: Admitting: Orthopaedic Surgery

## 2024-07-11 VITALS — BP 142/88 | HR 88 | Ht 75.0 in | Wt 192.0 lb

## 2024-07-11 DIAGNOSIS — M21372 Foot drop, left foot: Secondary | ICD-10-CM

## 2024-07-11 DIAGNOSIS — F17210 Nicotine dependence, cigarettes, uncomplicated: Secondary | ICD-10-CM

## 2024-07-11 DIAGNOSIS — G8929 Other chronic pain: Secondary | ICD-10-CM | POA: Diagnosis not present

## 2024-07-11 DIAGNOSIS — M25562 Pain in left knee: Secondary | ICD-10-CM

## 2024-07-11 NOTE — Progress Notes (Signed)
 He did not get the braces.  I have written new Rx for left knee brace and AFO for the left foot plus adjustment to the shoe height on the left.  Encounter Diagnoses  Name Primary?   Foot drop, left Yes   Chronic pain of left knee    Nicotine  dependence, cigarettes, uncomplicated    Return after he gets the braces.  He has foot drop on the left and unstable knee.  He uses crutches.  I have informed the patient I will be retiring from medical practice and from this office on July 12, 2024.  The patient has been offered continuing care with Dr. Margrette or Dr. Onesimo of this office.  The patient may choose another provider and the records will be forwarded after proper signature and notification.  Patient understands and agrees.  Call if any problem.  Precautions discussed.  Electronically Signed Lemond Stable, MD 10/1/20253:12 PM

## 2024-07-11 NOTE — Patient Instructions (Signed)
 [facility]: Prostethic and orthotics [address]  [phone]  Take order to [facility] for your brace, you will need to call them to make appointment to be fitted.

## 2024-07-16 ENCOUNTER — Other Ambulatory Visit: Payer: Self-pay | Admitting: Family Medicine

## 2024-07-16 DIAGNOSIS — F339 Major depressive disorder, recurrent, unspecified: Secondary | ICD-10-CM

## 2024-07-24 ENCOUNTER — Encounter: Payer: Self-pay | Admitting: Internal Medicine

## 2024-07-24 ENCOUNTER — Ambulatory Visit (INDEPENDENT_AMBULATORY_CARE_PROVIDER_SITE_OTHER): Payer: Self-pay | Admitting: Internal Medicine

## 2024-07-24 ENCOUNTER — Telehealth: Payer: Self-pay

## 2024-07-24 VITALS — BP 136/88 | HR 91 | Ht 75.0 in | Wt 164.0 lb

## 2024-07-24 DIAGNOSIS — M25511 Pain in right shoulder: Secondary | ICD-10-CM

## 2024-07-24 DIAGNOSIS — W19XXXA Unspecified fall, initial encounter: Secondary | ICD-10-CM | POA: Diagnosis not present

## 2024-07-24 MED ORDER — UNIVERSAL ARM SLING MISC: 0 refills | Status: DC

## 2024-07-24 MED ORDER — MELOXICAM 15 MG PO TABS: 15.0000 mg | ORAL_TABLET | Freq: Every day | ORAL | 0 refills | Status: AC

## 2024-07-24 MED ORDER — UNIVERSAL ARM SLING MISC: 0 refills | Status: AC

## 2024-07-24 MED ORDER — KETOROLAC TROMETHAMINE 60 MG/2ML IM SOLN
60.0000 mg | Freq: Once | INTRAMUSCULAR | Status: AC
  Administered 2024-07-24: 60 mg via INTRAMUSCULAR

## 2024-07-24 NOTE — Patient Instructions (Addendum)
 Please start taking Meloxicam as needed for shoulder pain.  Please get X-ray of right shoulder done.  You are being referred to Orthopedic surgery.

## 2024-07-24 NOTE — Assessment & Plan Note (Signed)
 Likely had direct impact injury to right shoulder from the fall Advised to get x-ray of right shoulder Toradol IM today Meloxicam for right shoulder pain Upper arm sling prescription provided Advised to follow up with orthopedic surgery

## 2024-07-24 NOTE — Assessment & Plan Note (Signed)
 Reports mechanical fall His right shoulder injury, check x-ray of right shoulder Referred to orthopedic surgery for further evaluation Toradol IM today Meloxicam prescribed

## 2024-07-24 NOTE — Telephone Encounter (Signed)
Meredith advised.

## 2024-07-24 NOTE — Progress Notes (Signed)
 Acute Office Visit  Subjective:    Patient ID: Gregory Payne, male    DOB: 09/06/1961, 63 y.o.   MRN: [id number]  Chief Complaint  Patient presents with   Fall    Patient fell last night, having pain in the right side from shoulder, down the arm    HPI Patient is in today for complaint of acute on chronic right shoulder pain after a fall on 07/23/24.  He denies any prodromal symptoms prior to the fall, including dizziness, palpitations, chest pain or dyspnea.  He reports a mechanical fall due to stumbling upon an object, but chart review suggests history of polysubstance abuse in the past.  Denies any head injury or LOC.  He complains of right shoulder pain and inability to move the shoulder more than 45 degrees.  His pain is constant, sharp, radiating towards scapular area and worse with movement.  He has had similar right shoulder pain in the past, reports remote history of right shoulder injury.  He has been referred to orthopedic surgery in the past, but has not scheduled appointment yet.  He requests a sling for now.  Past Medical History:  Diagnosis Date   Anxiety    Chronic pain    Depression    Diabetes mellitus    Fibromyalgia    GERD (gastroesophageal reflux disease)    Hypercholesteremia    Hypertension    Kidney stones    Schizophrenia [facility])     Past Surgical History:  Procedure Laterality Date   CERVICAL DISC SURGERY     ESOPHAGOGASTRODUODENOSCOPY  01/28/2012   Procedure: ESOPHAGOGASTRODUODENOSCOPY (EGD);  Surgeon: Claudis RAYMOND Rivet, MD;  Location: [facility];  Service: Endoscopy;  Laterality: N/A;  1200   HERNIA REPAIR     left inguinal hernia   LITHOTRIPSY      Family History  Problem Relation Age of Onset   Diabetes Mother    Kidney failure Mother    Hyperlipidemia Father    Alcoholism Father    Arthritis Daughter    Colon cancer Neg Hx    Prostate cancer Neg Hx     Social History   Socioeconomic History   Marital status: Single    Spouse  name: Not on file   Number of children: 1   Years of education: 8th   Highest education level: Not on file  Occupational History   Not on file  Tobacco Use   Smoking status: Every Day    Current packs/day: 0.50    Average packs/day: 0.5 packs/day for 35.0 years (17.5 ttl pk-yrs)    Types: Cigarettes   Smokeless tobacco: Not on file   Tobacco comments:    He has been smoking since 63 years old. He is not ready to quit smoking.  Vaping Use   Vaping status: Every Day   Devices: he started vaping this year after his former PCP left.  Substance and Sexual Activity   Alcohol use: Yes    Comment: one 24 ounce can of beer once a week.   Drug use: Yes    Types: Marijuana    Comment: he has been using marijuana since he was 17, he has used cocaine in the past.   Sexual activity: Yes  Other Topics Concern   Not on file  Social History Narrative   Pt lives by himself, he has not been around people  because of his mental illness.    Social Drivers of Corporate investment banker Strain: Low  Risk  (11/12/2021)   Overall Financial Resource Strain (CARDIA)    Difficulty of Paying Living Expenses: Not hard at all  Food Insecurity: No Food Insecurity (11/12/2021)   Hunger Vital Sign    Worried About Running Out of Food in the Last Year: Never true    Ran Out of Food in the Last Year: Never true  Transportation Needs: No Transportation Needs (11/12/2021)   PRAPARE - Administrator, Civil Service (Medical): No    Lack of Transportation (Non-Medical): No  Physical Activity: Insufficiently Active (11/06/2021)   Exercise Vital Sign    Days of Exercise per Week: 1 day    Minutes of Exercise per Session: 10 min  Stress: Stress Concern Present (11/06/2021)   [employer] of Occupational Health - Occupational Stress Questionnaire    Feeling of Stress : To some extent  Social Connections: Socially Isolated (11/12/2021)   Social Connection and Isolation Panel    Frequency of Communication  with Friends and Family: Twice a week    Frequency of Social Gatherings with Friends and Family: Once a week    Attends Religious Services: Never    Database administrator or Organizations: No    Attends Banker Meetings: Never    Marital Status: Divorced  Catering manager Violence: Not At Risk (11/12/2021)   Humiliation, Afraid, Rape, and Kick questionnaire    Fear of Current or Ex-Partner: No    Emotionally Abused: No    Physically Abused: No    Sexually Abused: No    Outpatient Medications Prior to Visit  Medication Sig Dispense Refill   acetaminophen  (TYLENOL  8 HOUR) 650 MG CR tablet Take 1 tablet (650 mg total) by mouth every 8 (eight) hours as needed for pain. 90 tablet 1   amLODipine  (NORVASC ) 5 MG tablet TAKE 1 TABLET BY MOUTH ONCE A DAY. 90 tablet 0   baclofen  (LIORESAL ) 10 MG tablet Take 1 tablet (10 mg total) by mouth at bedtime as needed for muscle spasms. 90 each 1   cetirizine  (ZYRTEC  ALLERGY) 10 MG tablet Take 1 tablet (10 mg total) by mouth daily. 60 tablet 1   cyanocobalamin 1000 MCG tablet Take 1,000 mcg by mouth daily.     docusate sodium  (COLACE) 100 MG capsule Take 1 capsule (100 mg total) by mouth daily as needed for mild constipation. Reported on 01/14/2016 30 capsule 1   DULoxetine  (CYMBALTA ) 30 MG capsule TAKE ONE CAPSULE BY MOUTH EVERY DAY 30 capsule 0   ezetimibe  (ZETIA ) 10 MG tablet Take 1 tablet (10 mg total) by mouth daily. 90 tablet 3   fluticasone  (FLONASE ) 50 MCG/ACT nasal spray Place 2 sprays into both nostrils daily as needed for allergies or rhinitis. 15.8 mL 1   gabapentin  (NEURONTIN ) 400 MG capsule TAKE ONE CAPSULE BY MOUTH FOUR TIMES DAILY 120 capsule 0   metFORMIN  (GLUCOPHAGE ) 500 MG tablet Take 1 tablet (500 mg total) by mouth daily. 90 tablet 3   OLANZapine  (ZYPREXA ) 5 MG tablet TAKE ONE TABLET BY MOUTH TWICE DAILY 60 tablet 0   omeprazole (PRILOSEC) 20 MG capsule TAKE 1 CAPSULE BY MOUTH ONCE DAILY FOR STOMACH. 90 capsule 0   pravastatin   (PRAVACHOL ) 80 MG tablet Take 1 tablet (80 mg total) by mouth daily. 90 tablet 4   pyridOXINE (VITAMIN B6) 50 MG tablet Take 1 tablet (50 mg total) by mouth daily. 90 tablet 1   silver  sulfADIAZINE  (SILVADENE ) 1 % cream Apply 1 Application topically daily. 50 g 0  TRINTELLIX  5 MG TABS tablet TAKE (1) TABLET BY MOUTH DAILY 90 tablet 0   VITAMIN D , CHOLECALCIFEROL, PO Take by mouth.     Vitamin D , Ergocalciferol , (DRISDOL ) 1.25 MG (50000 UNIT) CAPS capsule Take 1 capsule (50,000 Units total) by mouth every 7 (seven) days. 20 capsule 1   meloxicam (MOBIC) 15 MG tablet Take 15 mg by mouth daily.     No facility-administered medications prior to visit.    Allergies  Allergen Reactions   Naproxen     REACTION: GI bleed from internal Hemmorhoids    Review of Systems  Constitutional:  Negative for chills and fever.  HENT:  Negative for congestion and sore throat.   Eyes:  Negative for pain and discharge.  Respiratory:  Negative for cough and shortness of breath.   Cardiovascular:  Negative for chest pain and palpitations.  Gastrointestinal:  Negative for diarrhea, nausea and vomiting.  Endocrine: Negative for polydipsia and polyuria.  Genitourinary:  Negative for dysuria and hematuria.  Musculoskeletal:  Positive for arthralgias (Right shoulder) and gait problem. Negative for neck pain and neck stiffness.  Skin:  Negative for rash.  Neurological:  Negative for dizziness and weakness.  Psychiatric/Behavioral:  Negative for agitation and behavioral problems. The patient is nervous/anxious.        Objective:    Physical Exam Vitals reviewed.  Constitutional:      General: He is not in acute distress.    Appearance: He is not diaphoretic.     Comments: Has a cane  HENT:     Nose: Nose normal.     Mouth/Throat:     Mouth: Mucous membranes are moist.  Eyes:     General: No scleral icterus.    Extraocular Movements: Extraocular movements intact.  Cardiovascular:     Rate and  Rhythm: Normal rate and regular rhythm.     Heart sounds: Normal heart sounds. No murmur heard. Pulmonary:     Breath sounds: Normal breath sounds. No wheezing or rales.  Abdominal:     Palpations: Abdomen is soft.  Musculoskeletal:     Right shoulder: Tenderness present. No swelling. Decreased range of motion.     Cervical back: Neck supple. No tenderness.     Right lower leg: No edema.     Left lower leg: No edema.  Skin:    General: Skin is warm.     Findings: No rash.  Neurological:     General: No focal deficit present.     Mental Status: He is alert and oriented to person, place, and time.  Psychiatric:        Mood and Affect: Mood normal.        Behavior: Behavior normal.     BP 136/88 (BP Location: Left Arm)   Pulse 91   Ht 6' 3 (1.905 m)   Wt 164 lb (74.4 kg)   SpO2 98%   BMI 20.50 kg/m  Wt Readings from Last 3 Encounters:  07/24/24 164 lb (74.4 kg)  07/11/24 192 lb (87.1 kg)  12/07/23 192 lb (87.1 kg)        Assessment & Plan:   Problem List Items Addressed This Visit       Other   Acute on chronic right shoulder pain - Primary   Likely had direct impact injury to right shoulder from the fall Advised to get x-ray of right shoulder Toradol IM today Meloxicam for right shoulder pain Upper arm sling prescription provided Advised to follow up with orthopedic  surgery      Relevant Medications   meloxicam (MOBIC) 15 MG tablet   Elastic Bandages & Supports (UNIVERSAL ARM SLING) MISC   Fall   Reports mechanical fall His right shoulder injury, check x-ray of right shoulder Referred to orthopedic surgery for further evaluation Toradol IM today Meloxicam prescribed        Meds ordered this encounter  Medications   DISCONTD: Elastic Bandages & Supports (UNIVERSAL ARM SLING) MISC    Sig: Please apply it over right shoulder for stabilization.    Dispense:  1 each    Refill:  0   meloxicam (MOBIC) 15 MG tablet    Sig: Take 1 tablet (15 mg total)  by mouth daily.    Dispense:  30 tablet    Refill:  0   Elastic Bandages & Supports (UNIVERSAL ARM SLING) MISC    Sig: Please apply it over right shoulder for stabilization.    Dispense:  1 each    Refill:  0   ketorolac (TORADOL) injection 60 mg     Suzzane MARLA Blanch, MD

## 2024-07-27 ENCOUNTER — Other Ambulatory Visit: Payer: Self-pay | Admitting: Family Medicine

## 2024-07-27 DIAGNOSIS — M21372 Foot drop, left foot: Secondary | ICD-10-CM

## 2024-07-27 DIAGNOSIS — F200 Paranoid schizophrenia: Secondary | ICD-10-CM

## 2024-08-15 ENCOUNTER — Encounter: Admitting: Orthopedic Surgery

## 2024-08-29 ENCOUNTER — Other Ambulatory Visit: Payer: Self-pay | Admitting: Family Medicine

## 2024-08-29 DIAGNOSIS — F200 Paranoid schizophrenia: Secondary | ICD-10-CM

## 2024-08-29 DIAGNOSIS — I10 Essential (primary) hypertension: Secondary | ICD-10-CM

## 2024-08-29 DIAGNOSIS — F339 Major depressive disorder, recurrent, unspecified: Secondary | ICD-10-CM

## 2024-08-29 DIAGNOSIS — M21372 Foot drop, left foot: Secondary | ICD-10-CM

## 2024-09-19 ENCOUNTER — Other Ambulatory Visit: Payer: Self-pay | Admitting: Family Medicine

## 2024-09-19 DIAGNOSIS — E559 Vitamin D deficiency, unspecified: Secondary | ICD-10-CM

## 2024-09-25 ENCOUNTER — Other Ambulatory Visit: Payer: Self-pay | Admitting: Family Medicine

## 2024-09-25 DIAGNOSIS — M21372 Foot drop, left foot: Secondary | ICD-10-CM

## 2024-09-26 ENCOUNTER — Other Ambulatory Visit: Payer: Self-pay | Admitting: Family Medicine

## 2024-09-26 DIAGNOSIS — F339 Major depressive disorder, recurrent, unspecified: Secondary | ICD-10-CM

## 2024-09-26 DIAGNOSIS — F200 Paranoid schizophrenia: Secondary | ICD-10-CM

## 2024-10-24 ENCOUNTER — Other Ambulatory Visit: Payer: Self-pay | Admitting: Family Medicine

## 2024-10-24 DIAGNOSIS — F339 Major depressive disorder, recurrent, unspecified: Secondary | ICD-10-CM

## 2024-10-24 DIAGNOSIS — F200 Paranoid schizophrenia: Secondary | ICD-10-CM

## 2024-10-24 DIAGNOSIS — M21372 Foot drop, left foot: Secondary | ICD-10-CM

## 2024-11-14 ENCOUNTER — Other Ambulatory Visit: Payer: Self-pay | Admitting: Family Medicine

## 2024-11-14 DIAGNOSIS — E1169 Type 2 diabetes mellitus with other specified complication: Secondary | ICD-10-CM

## 2024-11-22 ENCOUNTER — Ambulatory Visit: Admitting: Family Medicine

## 2024-12-26 ENCOUNTER — Ambulatory Visit: Admitting: Nurse Practitioner
